# Patient Record
Sex: Male | Born: 1935 | Race: White | Hispanic: No | Smoking: Never smoker
Health system: Southern US, Community
[De-identification: ages and names within clinical notes are randomized; demographics above are authoritative.]

## PROBLEM LIST (undated history)

## (undated) DIAGNOSIS — I442 Atrioventricular block, complete: Secondary | ICD-10-CM

## (undated) DIAGNOSIS — K2270 Barrett's esophagus without dysplasia: Secondary | ICD-10-CM

## (undated) DIAGNOSIS — K219 Gastro-esophageal reflux disease without esophagitis: Secondary | ICD-10-CM

## (undated) DIAGNOSIS — I10 Essential (primary) hypertension: Secondary | ICD-10-CM

## (undated) HISTORY — PX: LIVER BIOPSY: SHX301

## (undated) HISTORY — DX: Gastro-esophageal reflux disease without esophagitis: K21.9

## (undated) HISTORY — DX: Atrioventricular block, complete: I44.2

## (undated) HISTORY — DX: Barrett's esophagus without dysplasia: K22.70

## (undated) HISTORY — DX: Essential (primary) hypertension: I10

## (undated) HISTORY — PX: UPPER GASTROINTESTINAL ENDOSCOPY: SHX188

## (undated) HISTORY — PX: COLONOSCOPY: SHX174

---

## 2002-10-21 ENCOUNTER — Ambulatory Visit (HOSPITAL_COMMUNITY): Admission: RE | Admit: 2002-10-21 | Discharge: 2002-10-21 | Payer: Self-pay | Admitting: Internal Medicine

## 2005-01-18 ENCOUNTER — Ambulatory Visit: Payer: Self-pay | Admitting: Internal Medicine

## 2006-01-25 ENCOUNTER — Ambulatory Visit: Payer: Self-pay | Admitting: Internal Medicine

## 2010-07-11 ENCOUNTER — Ambulatory Visit (INDEPENDENT_AMBULATORY_CARE_PROVIDER_SITE_OTHER): Payer: Medicare HMO | Admitting: Internal Medicine

## 2010-07-11 DIAGNOSIS — K7689 Other specified diseases of liver: Secondary | ICD-10-CM

## 2010-07-11 DIAGNOSIS — K219 Gastro-esophageal reflux disease without esophagitis: Secondary | ICD-10-CM

## 2011-06-21 ENCOUNTER — Encounter (INDEPENDENT_AMBULATORY_CARE_PROVIDER_SITE_OTHER): Payer: Self-pay | Admitting: *Deleted

## 2011-07-10 ENCOUNTER — Ambulatory Visit (INDEPENDENT_AMBULATORY_CARE_PROVIDER_SITE_OTHER): Payer: Medicare HMO | Admitting: Internal Medicine

## 2011-08-21 ENCOUNTER — Ambulatory Visit (INDEPENDENT_AMBULATORY_CARE_PROVIDER_SITE_OTHER): Payer: Medicare HMO | Admitting: Internal Medicine

## 2011-10-15 ENCOUNTER — Ambulatory Visit (INDEPENDENT_AMBULATORY_CARE_PROVIDER_SITE_OTHER): Payer: Medicare HMO | Admitting: Internal Medicine

## 2011-10-15 ENCOUNTER — Encounter (INDEPENDENT_AMBULATORY_CARE_PROVIDER_SITE_OTHER): Payer: Self-pay | Admitting: Internal Medicine

## 2011-10-15 VITALS — BP 140/90 | HR 78 | Temp 97.7°F | Resp 20 | Ht 68.0 in | Wt 202.7 lb

## 2011-10-15 DIAGNOSIS — K7469 Other cirrhosis of liver: Secondary | ICD-10-CM

## 2011-10-15 DIAGNOSIS — I10 Essential (primary) hypertension: Secondary | ICD-10-CM | POA: Insufficient documentation

## 2011-10-15 DIAGNOSIS — K429 Umbilical hernia without obstruction or gangrene: Secondary | ICD-10-CM | POA: Insufficient documentation

## 2011-10-15 DIAGNOSIS — K219 Gastro-esophageal reflux disease without esophagitis: Secondary | ICD-10-CM

## 2011-10-15 DIAGNOSIS — K7460 Unspecified cirrhosis of liver: Secondary | ICD-10-CM

## 2011-10-15 DIAGNOSIS — Z86010 Personal history of colonic polyps: Secondary | ICD-10-CM | POA: Insufficient documentation

## 2011-10-15 NOTE — Progress Notes (Signed)
Presenting complaint;  Followup for cirrhosis and GERD.  Subjective:  Patient is 78 year old Caucasian male who is here for scheduled visit he was last seen in May 2012. He has lost about 5 pounds. He feels fine. He has good appetite. He denies heartburn dysphagia abdominal pain melena or rectal bleeding. His bowels move every day. 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 He lost his wife earlier this year secondary to CHF. He is not having any pain due to umbilicus hernia.  Current Medications: Current Outpatient Prescriptions  Medication Sig Dispense Refill  . aspirin 81 MG tablet Take 81 mg by mouth daily.      . metoprolol (LOPRESSOR) 50 MG tablet Take 50 mg by mouth. Patient takes 1/2 tablet  Twice a day      . milk thistle 175 MG tablet Take 175 mg by mouth.      . MULTIPLE VITAMINS PO Take by mouth daily.      Marland Kitchen omeprazole (PRILOSEC) 20 MG capsule Take by mouth.      . vitamin E 400 UNIT capsule Take 400 Units by mouth daily.         Objective: Blood pressure 140/90, pulse 78, temperature 97.7 F (36.5 C), temperature source Oral, resp. rate 20, height 5\' 8"  (1.727 m), weight 202 lb 11.2 oz (91.944 kg). Patient is alert and does not have asterixis Conjunctiva is pink. Sclera is nonicteric Oropharyngeal mucosa is normal. No neck masses or thyromegaly noted. He has bilateral painless gynecomastia. Cardiac exam with regular rhythm normal S1 and S2. No murmur or gallop noted. Lungs are clear to auscultation. Abdomen is full. Small umbilicus hernia which is completely reducible but skin has bluish discoloration. Abdomen is soft. Spleen is not palpable. Liver edge is indistinct below RCM. Shifting dullness is absent. No LE edema or clubbing noted.   Assessment:  #1. Cryptogenic cirrhosis. He presented in December 2001 with ascites. Diagnosis confirmed via laparoscopic biopsy. His condition is improved great deal and he has not required diuretic therapy for past few years. He needs to be  screened for [facility]. Last ultrasound was in may 2012. #2. Chronic GERD complicated by short segment Barrett's esophagus. Symptoms are well controlled with therapy. Last EGD was in March 2010. #3. History of colonic adenomas. Last colonoscopy in March 2010. #4. Umbilicus hernia. It is not causing any symptoms the skin has bluish discoloration. Therefore he needs to keep an eye on it and let us or Dr. Sherril Croon know if there's any change   Plan:  He will go to the lab for CBC, comprehensive chemistry panel, TSH and AFP. Hepatic ultrasound for screening. I will contact patient with results of the studies and plan to see him back in one year. Next EGD and colonoscopy would be in March 2015.

## 2011-10-15 NOTE — Patient Instructions (Signed)
Physician will contact you with results of ultrasound and blood work and copy will be sent to Dr. Sherril Croon.

## 2011-10-16 LAB — COMPREHENSIVE METABOLIC PANEL
AST: 20 U/L (ref 0–37)
Alkaline Phosphatase: 72 U/L (ref 39–117)
BUN: 12 mg/dL (ref 6–23)
Creat: 0.9 mg/dL (ref 0.50–1.35)

## 2011-10-16 LAB — CBC
HCT: 39.4 % (ref 39.0–52.0)
MCH: 32 pg (ref 26.0–34.0)
MCHC: 35 g/dL (ref 30.0–36.0)
MCV: 91.4 fL (ref 78.0–100.0)
RDW: 13 % (ref 11.5–15.5)

## 2011-11-01 ENCOUNTER — Telehealth (INDEPENDENT_AMBULATORY_CARE_PROVIDER_SITE_OTHER): Payer: Self-pay | Admitting: *Deleted

## 2011-11-01 DIAGNOSIS — K7460 Unspecified cirrhosis of liver: Secondary | ICD-10-CM

## 2011-11-01 NOTE — Telephone Encounter (Signed)
Per Dr.Rehman the patient will need a AFP done in 6 months,noted for May 03 2013

## 2012-02-05 ENCOUNTER — Encounter (INDEPENDENT_AMBULATORY_CARE_PROVIDER_SITE_OTHER): Payer: Self-pay

## 2012-04-17 ENCOUNTER — Encounter (INDEPENDENT_AMBULATORY_CARE_PROVIDER_SITE_OTHER): Payer: Self-pay | Admitting: *Deleted

## 2012-04-17 ENCOUNTER — Telehealth (INDEPENDENT_AMBULATORY_CARE_PROVIDER_SITE_OTHER): Payer: Self-pay | Admitting: *Deleted

## 2012-04-17 DIAGNOSIS — K7460 Unspecified cirrhosis of liver: Secondary | ICD-10-CM

## 2012-04-17 NOTE — Telephone Encounter (Signed)
Lab order printed

## 2012-05-07 LAB — AFP TUMOR MARKER: AFP-Tumor Marker: 4 ng/mL (ref 0.0–8.0)

## 2013-12-15 ENCOUNTER — Encounter (INDEPENDENT_AMBULATORY_CARE_PROVIDER_SITE_OTHER): Payer: Self-pay | Admitting: Internal Medicine

## 2013-12-15 ENCOUNTER — Ambulatory Visit (INDEPENDENT_AMBULATORY_CARE_PROVIDER_SITE_OTHER): Payer: PRIVATE HEALTH INSURANCE | Admitting: Internal Medicine

## 2013-12-15 ENCOUNTER — Encounter (INDEPENDENT_AMBULATORY_CARE_PROVIDER_SITE_OTHER): Payer: Self-pay | Admitting: *Deleted

## 2013-12-15 VITALS — BP 130/76 | HR 72 | Temp 97.2°F | Resp 18 | Ht 68.0 in | Wt 207.0 lb

## 2013-12-15 DIAGNOSIS — K7460 Unspecified cirrhosis of liver: Secondary | ICD-10-CM

## 2013-12-15 DIAGNOSIS — K7469 Other cirrhosis of liver: Secondary | ICD-10-CM

## 2013-12-15 DIAGNOSIS — K2270 Barrett's esophagus without dysplasia: Secondary | ICD-10-CM

## 2013-12-15 DIAGNOSIS — K2100 Gastro-esophageal reflux disease with esophagitis, without bleeding: Secondary | ICD-10-CM

## 2013-12-15 NOTE — Patient Instructions (Signed)
Ultrasound of upper abdomen to be scheduled. Request recent blood work from Dr.Vyas,s before any tests ordered

## 2013-12-15 NOTE — Progress Notes (Signed)
Presenting complaint;  Followup for GERD with Barrett,s and cryptogenic cirrhosis.  Subjective:  Patient is 80 year old Caucasian male who presents for scheduled visit. He was last seen in July 2013. He was supposed to come back in July 2014. He states he kept waiting and never heard from our office and finally decided to call to make an appointment. He has no complaints. Even though he is retired he stays busy. 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 He rarely experiences heartburn. He also denies dysphagia cough sore throat or hoarseness. He also denies abdominal distention or pain. Bowels move daily. He denies melena or rectal bleeding. He has very good appetite and has gained 5 pounds since his last visit.  GI history;  Patient was diagnosed with cirrhosis when he presented with new onset of ascites in December 2001. Biochemical markers were negative. He had laparoscopic liver biopsy and found to have stage IV cirrhosis felt to be cryptogenic. He also has chronic GERD and was diagnosed with short segment Barrett's esophagus. Last EGD was in March 2010. He also has history of colonic adenomas and last colonoscopy was in March 2010. Last abdominal ultrasound was in August 2013.    Current Medications: Outpatient Encounter Prescriptions as of 12/15/2013  Medication Sig  . aspirin 81 MG tablet Take 81 mg by mouth daily.  . metoprolol (LOPRESSOR) 50 MG tablet Take 50 mg by mouth. Patient takes 1/2 tablet  Twice a day  . milk thistle 175 MG tablet Take 175 mg by mouth.  . MULTIPLE VITAMINS PO Take by mouth daily.  Marland Kitchen omeprazole (PRILOSEC) 20 MG capsule Take by mouth.  . vitamin E 400 UNIT capsule Take 400 Units by mouth. Patient states that he takes 1 by mouth once a week.    Objective: Blood pressure 130/76, pulse 72, temperature 97.2 F (36.2 C), temperature source Oral, resp. rate 18, height 5\' 8"  (1.727 m), weight 207 lb (93.895  kg). Patient is alert and does not have asterixis. Conjunctiva is pink. Sclera is nonicteric Oropharyngeal mucosa is normal. No neck masses or thyromegaly noted. Cardiac exam with regular rhythm normal S1 and S2. No murmur or gallop noted. Lungs are clear to auscultation. Abdomen is full but soft and nontender without organomegaly or masses.  No LE edema or clubbing noted.  Labs/studies Results:   lab studies been requested from dr.Vyas,s office.  Assessment:  #1. Cryptogenic cirrhosis. He had ascites on initial presentation 14 years ago but his condition has progressed with resolution of ascites and preserved hepatic function. He is due for [facility] screening. #2. GERD complicated by short segment Barrett's. Heartburn is well controlled with therapy. Last EGD was in March 2010. #. History of colonic adenomas. Last colonoscopy was in March 2010.   Plan:  Request copy of blood work from Dr. Marcial Pacas office before any tests ordered. Upper abdominal ultrasound. Was scheduled for EGD and colonoscopy when patient is ready.

## 2014-07-08 ENCOUNTER — Encounter (INDEPENDENT_AMBULATORY_CARE_PROVIDER_SITE_OTHER): Payer: Self-pay

## 2014-08-31 ENCOUNTER — Encounter (INDEPENDENT_AMBULATORY_CARE_PROVIDER_SITE_OTHER): Payer: Self-pay | Admitting: *Deleted

## 2014-12-16 ENCOUNTER — Ambulatory Visit (INDEPENDENT_AMBULATORY_CARE_PROVIDER_SITE_OTHER): Payer: Medicare Other | Admitting: Internal Medicine

## 2014-12-16 ENCOUNTER — Encounter (INDEPENDENT_AMBULATORY_CARE_PROVIDER_SITE_OTHER): Payer: Self-pay | Admitting: Internal Medicine

## 2014-12-16 ENCOUNTER — Encounter (INDEPENDENT_AMBULATORY_CARE_PROVIDER_SITE_OTHER): Payer: Self-pay | Admitting: *Deleted

## 2014-12-16 VITALS — BP 110/62 | HR 56 | Temp 98.6°F | Ht 68.0 in | Wt 200.9 lb

## 2014-12-16 DIAGNOSIS — K7460 Unspecified cirrhosis of liver: Secondary | ICD-10-CM

## 2014-12-16 LAB — LIPID PANEL
CHOL/HDL RATIO: 4.3 ratio (ref <=5.0)
CHOLESTEROL: 212 mg/dL — AB (ref 125–200)
HDL: 49 mg/dL (ref >=40)
LDL Cholesterol: 134 mg/dL — ABNORMAL HIGH (ref <130)
TRIGLYCERIDES: 143 mg/dL (ref <150)
VLDL: 29 mg/dL (ref <30)

## 2014-12-16 LAB — CBC WITH DIFFERENTIAL/PLATELET
BASOS ABS: 0.1 10*3/uL (ref 0.0–0.1)
Basophils Relative: 2 % — ABNORMAL HIGH (ref 0–1)
Eosinophils Absolute: 0.2 10*3/uL (ref 0.0–0.7)
Eosinophils Relative: 4 % (ref 0–5)
HEMATOCRIT: 40 % (ref 39.0–52.0)
Hemoglobin: 13.9 g/dL (ref 13.0–17.0)
LYMPHS ABS: 1.2 10*3/uL (ref 0.7–4.0)
Lymphocytes Relative: 23 % (ref 12–46)
MCH: 32.3 pg (ref 26.0–34.0)
MCHC: 34.8 g/dL (ref 30.0–36.0)
MCV: 93 fL (ref 78.0–100.0)
MONOS PCT: 12 % (ref 3–12)
MPV: 10.9 fL (ref 8.6–12.4)
Monocytes Absolute: 0.6 10*3/uL (ref 0.1–1.0)
NEUTROS ABS: 3.2 10*3/uL (ref 1.7–7.7)
NEUTROS PCT: 59 % (ref 43–77)
PLATELETS: 144 10*3/uL — AB (ref 150–400)
RBC: 4.3 MIL/uL (ref 4.22–5.81)
RDW: 13.4 % (ref 11.5–15.5)
WBC: 5.4 10*3/uL (ref 4.0–10.5)

## 2014-12-16 LAB — HEPATIC FUNCTION PANEL
ALT: 17 U/L (ref 9–46)
AST: 20 U/L (ref 10–35)
Albumin: 4.3 g/dL (ref 3.6–5.1)
Alkaline Phosphatase: 85 U/L (ref 40–115)
BILIRUBIN DIRECT: 0.2 mg/dL (ref <=0.2)
BILIRUBIN INDIRECT: 0.6 mg/dL (ref 0.2–1.2)
BILIRUBIN TOTAL: 0.8 mg/dL (ref 0.2–1.2)
Total Protein: 7.1 g/dL (ref 6.1–8.1)

## 2014-12-16 NOTE — Patient Instructions (Addendum)
Labs. OV in 1 year. 

## 2014-12-16 NOTE — Progress Notes (Signed)
   Subjective:    Patient ID: Brett Bryant, male    DOB: [birth year], 81 y.o.   MRN: [id number]  HPI Here today for f/u of his cirrhosis.  Diagnosed with cirrhosis in 2001 with new onset of ascites. Biochemical markers were negative. He says he is doing good. He stays busy all the time. He takes care of the farm animals. Hx of chronic GERD and has short segment Barrett's esophagus. Last EGD in 2010.  His appetite has been good. He has lost 6 pounds since his last visit in September. No abdominal distention. No asterixis   No confusion.  Acid reflux controlled with Omeprazole.   Patient was diagnosed with cirrhosis when he presented with new onset of ascites in December 2001. Biochemical markers were negative. He had laparoscopic liver biopsy and found to have stage IV cirrhosis (Per Dr. Olevia Perches notes).   12/22/2013 US abdomen complete: cirrhosis:  No splenomegaly. Chololithiasis. Somewhat small liver but without definite marginal nodularity.   10/02/2013  H and H 13.9 and 39.5, Platelets 166, albumin 4.3, total bili 0.5, ALP 75, AST 23, ALT 20   Review of Systems Past Medical History  Diagnosis Date  . Hypertension   . GERD (gastroesophageal reflux disease)     Past Surgical History  Procedure Laterality Date  . Liver biopsy      Several Years ago per patient  . Colonoscopy    . Upper gastrointestinal endoscopy      No Known Allergies  Current Outpatient Prescriptions on File Prior to Visit  Medication Sig Dispense Refill  . aspirin 81 MG tablet Take 81 mg by mouth daily.    . metoprolol (LOPRESSOR) 50 MG tablet Take 50 mg by mouth. Patient takes 1/2 tablet  Twice a day    . milk thistle 175 MG tablet Take 175 mg by mouth.    . MULTIPLE VITAMINS PO Take by mouth daily.    Marland Kitchen omeprazole (PRILOSEC) 20 MG capsule Take by mouth.     No current facility-administered medications on file prior to visit.        Objective:   Physical Exam Blood pressure 110/62, pulse 56,  temperature 98.6 F (37 C), height 5\' 8"  (1.727 m), weight 200 lb 14.4 oz (91.128 kg). Alert and oriented. Skin warm and dry. Oral mucosa is moist.   . Sclera anicteric, conjunctivae is pink. Thyroid not enlarged. No cervical lymphadenopathy. Lungs clear. Heart regular rate and rhythm.  Abdomen is soft. Bowel sounds are positive. No hepatomegaly. No abdominal masses felt. No tenderness.  No edema to lower extremities.  No asterixis.       Assessment & Plan:  Cirrhosis. He is doing well. No abdominal distention. Liver numbers are normal. [place] RUQ, AFP, CBC with diff, Hepatic function. Lipid profile. OV in1 year.

## 2014-12-17 LAB — AFP TUMOR MARKER: AFP TUMOR MARKER: 4 ng/mL (ref <6.1)

## 2014-12-22 ENCOUNTER — Ambulatory Visit (HOSPITAL_COMMUNITY)
Admission: RE | Admit: 2014-12-22 | Discharge: 2014-12-22 | Disposition: A | Discharge status: Home / Self Care | Payer: Medicare Other | Source: Ambulatory Visit | Attending: Internal Medicine | Admitting: Internal Medicine

## 2014-12-22 DIAGNOSIS — K8020 Calculus of gallbladder without cholecystitis without obstruction: Secondary | ICD-10-CM | POA: Insufficient documentation

## 2014-12-22 DIAGNOSIS — K7460 Unspecified cirrhosis of liver: Secondary | ICD-10-CM

## 2014-12-22 DIAGNOSIS — R932 Abnormal findings on diagnostic imaging of liver and biliary tract: Secondary | ICD-10-CM | POA: Insufficient documentation

## 2015-08-11 ENCOUNTER — Encounter (INDEPENDENT_AMBULATORY_CARE_PROVIDER_SITE_OTHER): Payer: Self-pay | Admitting: Internal Medicine

## 2015-12-15 ENCOUNTER — Encounter (INDEPENDENT_AMBULATORY_CARE_PROVIDER_SITE_OTHER): Payer: Self-pay | Admitting: *Deleted

## 2015-12-15 ENCOUNTER — Ambulatory Visit (INDEPENDENT_AMBULATORY_CARE_PROVIDER_SITE_OTHER): Payer: 59 | Admitting: Internal Medicine

## 2015-12-15 ENCOUNTER — Encounter (INDEPENDENT_AMBULATORY_CARE_PROVIDER_SITE_OTHER): Payer: Self-pay | Admitting: Internal Medicine

## 2015-12-15 VITALS — BP 180/80 | HR 65 | Temp 98.5°F | Ht 68.0 in | Wt 206.6 lb

## 2015-12-15 DIAGNOSIS — K2270 Barrett's esophagus without dysplasia: Secondary | ICD-10-CM

## 2015-12-15 DIAGNOSIS — K760 Fatty (change of) liver, not elsewhere classified: Secondary | ICD-10-CM

## 2015-12-15 DIAGNOSIS — Z86010 Personal history of colonic polyps: Secondary | ICD-10-CM

## 2015-12-15 HISTORY — DX: Barrett's esophagus without dysplasia: K22.70

## 2015-12-15 NOTE — Patient Instructions (Signed)
Ov in 1 one year.

## 2015-12-15 NOTE — Progress Notes (Signed)
Subjective:    Patient ID: Brett Bryant, male    DOB: 1933-09-21, 82 y.o.   MRN: [id number]  HPI Here today for f/u of his cirrhosis.  Diagnosed with cirrhosis in 2001 with new onset of ascites. Biochemical markers were negative.  . He had laparoscopic liver biopsy and found to have stage IV cirrhosis (Per Dr. Olevia Perches notes). His liver enzymes in 2016 were normal.  Hx of short segment Barrett's and colonic adenomas.  He tells me he is doing good. No weight loss. His appetite has remained good. BM daily. No melena or BRRB. No abdominal pain. No confusion.    Hepatic Function Latest Ref Rng & Units 12/16/2014 10/15/2011  Total Protein 6.1 - 8.1 g/dL 7.1 6.8  Albumin 3.6 - 5.1 g/dL 4.3 4.4  AST 10 - 35 U/L 20 20  ALT 9 - 46 U/L 17 17  Alk Phosphatase 40 - 115 U/L 85 72  Total Bilirubin 0.2 - 1.2 mg/dL 0.8 0.4  Bilirubin, Direct <=0.2 mg/dL 0.2 -   CBC    Component Value Date/Time   WBC 5.4 12/16/2014 1059   RBC 4.30 12/16/2014 1059   HGB 13.9 12/16/2014 1059   HCT 40.0 12/16/2014 1059   PLT 144 (L) 12/16/2014 1059   MCV 93.0 12/16/2014 1059   MCH 32.3 12/16/2014 1059   MCHC 34.8 12/16/2014 1059   RDW 13.4 12/16/2014 1059   LYMPHSABS 1.2 12/16/2014 1059   MONOABS 0.6 12/16/2014 1059   EOSABS 0.2 12/16/2014 1059   BASOSABS 0.1 12/16/2014 1059     12/22/2014 [place] RUQ: cirrhosis;  The liver is somewhat echogenic consistent with fatty infiltration. The contours of the liver may be minimally nodular suggesting possible cirrhosis, but no focal hepatic abnormality is seen.  IMPRESSION: 1. Somewhat echogenic liver with minimal nodularity of the periphery suggesting possible cirrhosis. No focal abnormality. 2. Small gallstones.    Review of Systems      Past Medical History:  Diagnosis Date  . Barrett's esophagus 12/15/2015  . GERD (gastroesophageal reflux disease)   . Hypertension     Past Surgical History:  Procedure Laterality Date  . COLONOSCOPY    . LIVER BIOPSY      Several Years ago per patient  . UPPER GASTROINTESTINAL ENDOSCOPY      No Known Allergies  Current Outpatient Prescriptions on File Prior to Visit  Medication Sig Dispense Refill  . aspirin 81 MG tablet Take 81 mg by mouth daily.    . metoprolol (LOPRESSOR) 50 MG tablet Take 50 mg by mouth. Patient takes 1/2 tablet  Twice a day    . milk thistle 175 MG tablet Take 175 mg by mouth.    . MULTIPLE VITAMINS PO Take by mouth daily.    Marland Kitchen omeprazole (PRILOSEC) 20 MG capsule Take by mouth.     No current facility-administered medications on file prior to visit.       Objective:   Physical Exam Blood pressure (!) 180/80, pulse 65, temperature 98.5 F (36.9 C), height '5\' 8"'  (1.727 m), weight 206 lb 9.6 oz (93.7 kg). Alert and oriented. Skin warm and dry. Oral mucosa is moist.   . Sclera anicteric, conjunctivae is pink. Thyroid not enlarged. No cervical lymphadenopathy. Lungs clear. Heart regular rate and rhythm.  Abdomen is soft. Bowel sounds are positive. No hepatomegaly. No abdominal masses felt. No tenderness.  No edema to lower extremities.          Assessment & Plan:  NAFLD. He seems to  be doing well. Normal liver enzymes. CMET, CBC,  [place] RUQ. OV in 1 year.  . Chronic GERD complicated by short segment Barrett's esophagus. Symptoms are well controlled with therapy. Last EGD was in March 2010.  History of colonic adenomas. Last colonoscopy was in March 2010. EGD/Colonoscopy. Patient will let me know when he is ready to have this scheduled.

## 2015-12-16 LAB — CBC WITH DIFFERENTIAL/PLATELET
BASOS PCT: 1 %
Basophils Absolute: 56 cells/uL (ref 0–200)
EOS ABS: 224 {cells}/uL (ref 15–500)
EOS PCT: 4 %
HCT: 40.8 % (ref 38.5–50.0)
Hemoglobin: 13.6 g/dL (ref 13.2–17.1)
LYMPHS PCT: 27 %
Lymphs Abs: 1512 cells/uL (ref 850–3900)
MCH: 31.7 pg (ref 27.0–33.0)
MCHC: 33.3 g/dL (ref 32.0–36.0)
MCV: 95.1 fL (ref 80.0–100.0)
MONOS PCT: 12 %
MPV: 11.4 fL (ref 7.5–12.5)
Monocytes Absolute: 672 cells/uL (ref 200–950)
NEUTROS ABS: 3136 {cells}/uL (ref 1500–7800)
Neutrophils Relative %: 56 %
PLATELETS: 175 10*3/uL (ref 140–400)
RBC: 4.29 MIL/uL (ref 4.20–5.80)
RDW: 14 % (ref 11.0–15.0)
WBC: 5.6 10*3/uL (ref 3.8–10.8)

## 2015-12-16 LAB — COMPREHENSIVE METABOLIC PANEL
ALK PHOS: 67 U/L (ref 40–115)
ALT: 26 U/L (ref 9–46)
AST: 30 U/L (ref 10–35)
Albumin: 4 g/dL (ref 3.6–5.1)
BUN: 10 mg/dL (ref 7–25)
CHLORIDE: 103 mmol/L (ref 98–110)
CO2: 24 mmol/L (ref 20–31)
CREATININE: 0.86 mg/dL (ref 0.70–1.11)
Calcium: 10.7 mg/dL — ABNORMAL HIGH (ref 8.6–10.3)
GLUCOSE: 88 mg/dL (ref 65–99)
POTASSIUM: 4.7 mmol/L (ref 3.5–5.3)
SODIUM: 137 mmol/L (ref 135–146)
TOTAL PROTEIN: 6.9 g/dL (ref 6.1–8.1)
Total Bilirubin: 0.7 mg/dL (ref 0.2–1.2)

## 2015-12-21 ENCOUNTER — Ambulatory Visit (HOSPITAL_COMMUNITY)
Admission: RE | Admit: 2015-12-21 | Discharge: 2015-12-21 | Disposition: A | Discharge status: Home / Self Care | Payer: Medicare PPO | Source: Ambulatory Visit | Attending: Internal Medicine | Admitting: Internal Medicine

## 2015-12-21 DIAGNOSIS — K760 Fatty (change of) liver, not elsewhere classified: Secondary | ICD-10-CM | POA: Insufficient documentation

## 2015-12-21 DIAGNOSIS — K8020 Calculus of gallbladder without cholecystitis without obstruction: Secondary | ICD-10-CM | POA: Insufficient documentation

## 2015-12-21 DIAGNOSIS — R932 Abnormal findings on diagnostic imaging of liver and biliary tract: Secondary | ICD-10-CM | POA: Insufficient documentation

## 2016-07-03 ENCOUNTER — Ambulatory Visit (INDEPENDENT_AMBULATORY_CARE_PROVIDER_SITE_OTHER): Payer: Medicare HMO | Admitting: Urology

## 2016-07-03 DIAGNOSIS — R972 Elevated prostate specific antigen [PSA]: Secondary | ICD-10-CM

## 2016-12-25 ENCOUNTER — Ambulatory Visit (INDEPENDENT_AMBULATORY_CARE_PROVIDER_SITE_OTHER): Payer: Medicare HMO | Admitting: Urology

## 2016-12-25 DIAGNOSIS — N400 Enlarged prostate without lower urinary tract symptoms: Secondary | ICD-10-CM

## 2016-12-25 DIAGNOSIS — R972 Elevated prostate specific antigen [PSA]: Secondary | ICD-10-CM

## 2017-07-15 ENCOUNTER — Encounter (INDEPENDENT_AMBULATORY_CARE_PROVIDER_SITE_OTHER): Payer: Self-pay | Admitting: Internal Medicine

## 2017-07-15 ENCOUNTER — Encounter (INDEPENDENT_AMBULATORY_CARE_PROVIDER_SITE_OTHER): Payer: Self-pay | Admitting: *Deleted

## 2017-07-15 ENCOUNTER — Ambulatory Visit (INDEPENDENT_AMBULATORY_CARE_PROVIDER_SITE_OTHER): Payer: Medicare HMO | Admitting: Internal Medicine

## 2017-07-15 VITALS — BP 140/86 | HR 64 | Temp 98.2°F | Resp 18 | Ht 68.0 in | Wt 205.1 lb

## 2017-07-15 DIAGNOSIS — D126 Benign neoplasm of colon, unspecified: Secondary | ICD-10-CM | POA: Diagnosis not present

## 2017-07-15 DIAGNOSIS — K7460 Unspecified cirrhosis of liver: Secondary | ICD-10-CM

## 2017-07-15 DIAGNOSIS — K219 Gastro-esophageal reflux disease without esophagitis: Secondary | ICD-10-CM | POA: Diagnosis not present

## 2017-07-15 DIAGNOSIS — K2270 Barrett's esophagus without dysplasia: Secondary | ICD-10-CM | POA: Diagnosis not present

## 2017-07-15 NOTE — Patient Instructions (Signed)
OV in 1 year.  

## 2017-07-15 NOTE — Progress Notes (Signed)
Subjective:    Patient ID: Brett Bryant, male    DOB: [birth year], 84 y.o.   MRN: [id number]  HPI Referred by DR. Vyas for elevated liver enzymes. Hx of same and has known hx cirrhosis. Liver enzymes normal in April of this year. Last seen in September of 2017 for NAFLD.   He tells me he is doing well. Appetite is good. No weight loss. Has a BM daily.  He was diagnosed with cirrhosis in 2001 with new onset of ascites. He also had laproscopic liver biopsy and found to have stage 4 cirrhosis.  His last EGD/Colonosocpy was in 2010 for chronic GERD, Barrett's which revealed two short columns of grade 1 esophageal varices. Short segment Barrett's without obvious  Ulceration or mass. Three gastric polyps snared from antrum. The large one was 15-20 mm in diameter. Single diverticulum at hepatic flexure. Small polyp ileocecal valve and 6 mm polyp splenic flexure. Biopsy tubular adenoma. Esophagus: Barrett's    12/21/2015 [place] RUQ:  IMPRESSION: Cholelithiasis. No gallbladder wall thickening or pericholecystic fluid.  The liver echogenicity is increased with a subtly nodular contour. These are findings indicative of hepatic cirrhosis. While no focal liver lesions are identified, it must be cautioned that the sensitivity of ultrasound for focal liver lesions is diminished in this circumstance.  06/27/2017 total bili 0.4, ALP 106, AST 23, ALT 29.  H and H 13.6 and 40.5, Platelet ct 167    Review of Systems Past Medical History:  Diagnosis Date  . Barrett's esophagus 12/15/2015  . GERD (gastroesophageal reflux disease)   . Hypertension     Past Surgical History:  Procedure Laterality Date  . COLONOSCOPY    . LIVER BIOPSY     Several Years ago per patient  . UPPER GASTROINTESTINAL ENDOSCOPY      No Known Allergies  Current Outpatient Medications on File Prior to Visit  Medication Sig Dispense Refill  . aspirin 81 MG tablet Take 81 mg by mouth daily.    . Coenzyme Q10 (CO Q 10 PO)  Take 200 mg by mouth daily.    . metoprolol (LOPRESSOR) 50 MG tablet Take 50 mg by mouth. Patient takes 1/2 tablet  Twice a day    . milk thistle 175 MG tablet Take 175 mg by mouth.    Marland Kitchen omeprazole (PRILOSEC) 20 MG capsule Take by mouth.    . TURMERIC CURCUMIN PO Take 500 mg by mouth daily.    . vitamin B-12 (CYANOCOBALAMIN) 500 MCG tablet Take 500 mcg by mouth daily.    . vitamin E 400 UNIT capsule Take 400 Units by mouth daily.     No current facility-administered medications on file prior to visit.         Objective:   Physical Exam Blood pressure 140/86, pulse 64, temperature 98.2 F (36.8 C), temperature source Oral, resp. rate 18, height 5\' 8"  (1.727 m), weight 205 lb 1.6 oz (93 kg). Alert and oriented. Skin warm and dry. Oral mucosa is moist.   . Sclera anicteric, conjunctivae is pink. Thyroid not enlarged. No cervical lymphadenopathy. Lungs clear. Heart regular rate and rhythm.  Abdomen is soft. Bowel sounds are positive. No hepatomegaly. No abdominal masses felt. No tenderness.  No edema to lower extremities.           Assessment & Plan:  NAFLD. Will get an [place] today.  Normal liver enzymes and platelet ct normal.  He will let me know when he is ready for surveillance  EGD/Colonosocopy. Hx of  GERD, Barrett's, Tubular adenoma.

## 2017-07-19 ENCOUNTER — Ambulatory Visit (HOSPITAL_COMMUNITY)
Admission: RE | Admit: 2017-07-19 | Discharge: 2017-07-19 | Disposition: A | Discharge status: Home / Self Care | Payer: Medicare HMO | Source: Ambulatory Visit | Attending: Internal Medicine | Admitting: Internal Medicine

## 2017-07-19 DIAGNOSIS — K8020 Calculus of gallbladder without cholecystitis without obstruction: Secondary | ICD-10-CM | POA: Diagnosis not present

## 2017-07-19 DIAGNOSIS — R932 Abnormal findings on diagnostic imaging of liver and biliary tract: Secondary | ICD-10-CM | POA: Insufficient documentation

## 2017-07-19 DIAGNOSIS — K7460 Unspecified cirrhosis of liver: Secondary | ICD-10-CM | POA: Diagnosis not present

## 2017-08-06 ENCOUNTER — Ambulatory Visit: Payer: Medicare HMO | Admitting: Urology

## 2017-08-06 DIAGNOSIS — N400 Enlarged prostate without lower urinary tract symptoms: Secondary | ICD-10-CM | POA: Diagnosis not present

## 2017-08-06 DIAGNOSIS — R972 Elevated prostate specific antigen [PSA]: Secondary | ICD-10-CM | POA: Diagnosis not present

## 2018-07-15 ENCOUNTER — Encounter (INDEPENDENT_AMBULATORY_CARE_PROVIDER_SITE_OTHER): Payer: Self-pay | Admitting: Internal Medicine

## 2018-07-15 ENCOUNTER — Other Ambulatory Visit: Payer: Self-pay

## 2018-07-15 ENCOUNTER — Ambulatory Visit (INDEPENDENT_AMBULATORY_CARE_PROVIDER_SITE_OTHER): Payer: Medicare HMO | Admitting: Internal Medicine

## 2018-07-15 DIAGNOSIS — K7460 Unspecified cirrhosis of liver: Secondary | ICD-10-CM

## 2018-07-15 NOTE — Progress Notes (Addendum)
Subjective:    Patient ID: Brett Bryant, male    DOB: Jul 20, 1933, 85 y.o.   MRN: [id number]  HPI PCP Dr. Woody Seller. Start Time 10:45am. End time 1055. Total time 10 minutes.  Chief complaint  Cirrhosis.  Patient consents to this telephone OV. He is at home. I am in the office. Telephone OV due to risk of COVID-19 virus. Unable to do video OV.  He was last seen in office 07/15/2017. Also has hx of elevated liver enzymes. Enzymes  06/2017 were normal. Platelet ct normal. US abdomen 08/15/2017 revealed:  1. Cholelithiasis. No gallbladder wall thickening or pericholecystic fluid. 2. Diffuse increase in liver echogenicity, a finding indicative of hepatic steatosis with probable underlying parenchymal liver disease. While no focal liver lesions are evident on this study, it must be cautioned that the sensitivity of ultrasound for detection of focal liver lesions is diminished significantly in this circumstance. Diagnosed with cirrhosis in 2001 with new onset of ascites.  Laparoscopic liver biopsy showed stage 4 cirrhosis.  His last EGD/Colonosocpy was in 2010 for chronic GERD, Barrett's which revealed two short columns of grade 1 esophageal varices. Short segment Barrett's without obvious  Ulceration or mass. Three gastric polyps snared from antrum. The large one was 15-20 mm in diameter. Single diverticulum at hepatic flexure. Small polyp ileocecal valve and 6 mm polyp splenic flexure. Biopsy tubular adenoma. Esophagus: Barrett's  He tells me he is doing good.His appetite is good. No weight loss.  He is trying to exercise. 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 His BMs are normal. Denies abdominal distention.   CMP Latest Ref Rng & Units 12/15/2015 12/16/2014 10/15/2011  Glucose 65 - 99 mg/dL 88 - 90  BUN 7 - 25 mg/dL 10 - 12  Creatinine 0.70 - 1.11 mg/dL 0.86 - 0.90  Sodium 135 - 146 mmol/L 137 - 138  Potassium 3.5 - 5.3 mmol/L 4.7 - 4.8  Chloride 98 - 110 mmol/L 103 - 102  CO2 20 - 31 mmol/L 24 -  30  Calcium 8.6 - 10.3 mg/dL 10.7(H) - 10.3  Total Protein 6.1 - 8.1 g/dL 6.9 7.1 6.8  Total Bilirubin 0.2 - 1.2 mg/dL 0.7 0.8 0.4  Alkaline Phos 40 - 115 U/L 67 85 72  AST 10 - 35 U/L _0 ALT 9 - 46 U/L _1 Hepatic Function Latest Ref Rng & Units 12/15/2015 12/16/2014 10/15/2011  Total Protein 6.1 - 8.1 g/dL 6.9 7.1 6.8  Albumin 3.6 - 5.1 g/dL 4.0 4.3 4.4  AST 10 - 35 U/L _2 ALT 9 - 46 U/L _3 Alk Phosphatase 40 - 115 U/L 67 85 72  Total Bilirubin 0.2 - 1.2 mg/dL 0.7 0.8 0.4  Bilirubin, Direct <=0.2 mg/dL - 0.2 -     Review of Systems Past Medical History:  Diagnosis Date  . Barrett's esophagus 12/15/2015  . GERD (gastroesophageal reflux disease)   . Hypertension     Past Surgical History:  Procedure Laterality Date  . COLONOSCOPY    . LIVER BIOPSY     Several Years ago per patient  . UPPER GASTROINTESTINAL ENDOSCOPY      No Known Allergies  Current Outpatient Medications on File Prior to Visit  Medication Sig Dispense Refill  . aspirin 81 MG tablet Take 81 mg by mouth daily.    . Cholecalciferol (VITAMIN D3) 125 MCG (5000 UT) TABS Take 1,000 Units by mouth daily.    Marland Kitchen  Coenzyme Q10 (CO Q 10 PO) Take 200 mg by mouth daily.    . metoprolol (LOPRESSOR) 50 MG tablet Take 50 mg by mouth. Patient takes 1/2 tablet  Twice a day    . milk thistle 175 MG tablet Take 175 mg by mouth.    Marland Kitchen omeprazole (PRILOSEC) 20 MG capsule Take by mouth.    . TURMERIC CURCUMIN PO Take 500 mg by mouth daily.    . vitamin B-12 (CYANOCOBALAMIN) 500 MCG tablet Take 500 mcg by mouth daily.    . vitamin E 400 UNIT capsule Take 400 Units by mouth daily.     No current facility-administered medications on file prior to visit.         Objective:   Physical Exam Deferred       Assessment & Plan:  Cirrhosis: Needs surveillance [place] RUQ , CBC, Hepatic and AFP. OV in 1 year. Needs surveillance EGD and colonoscopy.Will call when he is ready. Does not want to schedule  now due to the COVID-19 virus.

## 2018-07-18 LAB — CBC
HCT: 39.1 % (ref 38.5–50.0)
Hemoglobin: 13.7 g/dL (ref 13.2–17.1)
MCH: 33.2 pg — ABNORMAL HIGH (ref 27.0–33.0)
MCHC: 35 g/dL (ref 32.0–36.0)
MCV: 94.7 fL (ref 80.0–100.0)
MPV: 12.6 fL — ABNORMAL HIGH (ref 7.5–12.5)
Platelets: 156 10*3/uL (ref 140–400)
RBC: 4.13 10*6/uL — ABNORMAL LOW (ref 4.20–5.80)
RDW: 12 % (ref 11.0–15.0)
WBC: 5.3 10*3/uL (ref 3.8–10.8)

## 2018-07-18 LAB — HEPATIC FUNCTION PANEL
AG Ratio: 1.7 (calc) (ref 1.0–2.5)
ALT: 17 U/L (ref 9–46)
AST: 15 U/L (ref 10–35)
Albumin: 4.3 g/dL (ref 3.6–5.1)
Alkaline phosphatase (APISO): 90 U/L (ref 35–144)
Bilirubin, Direct: 0.1 mg/dL (ref 0.0–0.2)
Globulin: 2.5 g/dL (calc) (ref 1.9–3.7)
Indirect Bilirubin: 0.4 mg/dL (calc) (ref 0.2–1.2)
Total Bilirubin: 0.5 mg/dL (ref 0.2–1.2)
Total Protein: 6.8 g/dL (ref 6.1–8.1)

## 2018-07-18 LAB — AFP TUMOR MARKER: AFP-Tumor Marker: 4.8 ng/mL (ref <6.1)

## 2018-08-19 ENCOUNTER — Other Ambulatory Visit: Payer: Self-pay

## 2018-08-19 ENCOUNTER — Ambulatory Visit (HOSPITAL_COMMUNITY)
Admission: RE | Admit: 2018-08-19 | Discharge: 2018-08-19 | Disposition: A | Discharge status: Home / Self Care | Payer: Medicare HMO | Source: Ambulatory Visit | Attending: Internal Medicine | Admitting: Internal Medicine

## 2018-08-19 DIAGNOSIS — K7460 Unspecified cirrhosis of liver: Secondary | ICD-10-CM | POA: Diagnosis not present

## 2018-09-23 ENCOUNTER — Ambulatory Visit (INDEPENDENT_AMBULATORY_CARE_PROVIDER_SITE_OTHER): Payer: Medicare HMO | Admitting: Urology

## 2018-09-23 DIAGNOSIS — R972 Elevated prostate specific antigen [PSA]: Secondary | ICD-10-CM | POA: Diagnosis not present

## 2018-09-23 DIAGNOSIS — N400 Enlarged prostate without lower urinary tract symptoms: Secondary | ICD-10-CM

## 2019-07-14 ENCOUNTER — Other Ambulatory Visit: Payer: Self-pay | Admitting: Urology

## 2019-07-14 ENCOUNTER — Other Ambulatory Visit: Payer: Self-pay

## 2019-07-14 DIAGNOSIS — R972 Elevated prostate specific antigen [PSA]: Secondary | ICD-10-CM

## 2019-08-25 ENCOUNTER — Encounter: Payer: Self-pay | Admitting: Urology

## 2019-08-25 ENCOUNTER — Other Ambulatory Visit: Payer: Self-pay

## 2019-08-25 ENCOUNTER — Ambulatory Visit (INDEPENDENT_AMBULATORY_CARE_PROVIDER_SITE_OTHER): Payer: Medicare HMO | Admitting: Urology

## 2019-08-25 VITALS — BP 178/73 | HR 80 | Temp 98.1°F | Ht 68.0 in | Wt 182.0 lb

## 2019-08-25 DIAGNOSIS — R972 Elevated prostate specific antigen [PSA]: Secondary | ICD-10-CM | POA: Diagnosis not present

## 2019-08-25 LAB — POCT URINALYSIS DIPSTICK
Bilirubin, UA: NEGATIVE
Blood, UA: NEGATIVE
Glucose, UA: NEGATIVE
Ketones, UA: NEGATIVE
Leukocytes, UA: NEGATIVE
Nitrite, UA: NEGATIVE
Protein, UA: NEGATIVE
Spec Grav, UA: 1.01 (ref 1.010–1.025)
Urobilinogen, UA: 0.2 E.U./dL
pH, UA: 8.5 — AB (ref 5.0–8.0)

## 2019-08-25 NOTE — Progress Notes (Signed)
H&P  Chief Complaint: Elevated PSA  History of Present Illness:   6.8.2021: Most recent PSA was 10.9. He reports stable overall urological health, denying any specific complaints especially with regards to his urination.  IPSS Questionnaire (AUA-7): Over the past month.   1)  How often have you had a sensation of not emptying your bladder completely after you finish urinating?  0 - Not at all  2)  How often have you had to urinate again less than two hours after you finished urinating? 3 - About half the time  3)  How often have you found you stopped and started again several times when you urinated?  0 - Not at all  4) How difficult have you found it to postpone urination?  3 - About half the time  5) How often have you had a weak urinary stream?  0 - Not at all  6) How often have you had to push or strain to begin urination?  0 - Not at all  7) How many times did you most typically get up to urinate from the time you went to bed until the time you got up in the morning?  1 - 1 time  Total score:  0-7 mildly symptomatic   8-19 moderately symptomatic   20-35 severely symptomatic   Total: 7 QoL: 1  (below copied from [facility] records):  Brett Bryant is a 86 year-old male established patient who is here for follow up for further evaluation of his elevated PSA.  His last PSA was performed 06/27/2017. The last PSA value was 5.8. The patient states he does not take 5 alpha reductase inhibitor medication.   He has not had a prostate biopsy done. Patient does not have a family history of prostate cancer.   4.17.2018: Sent by Dr. Manuella Ghazi for elevating PSA trend. The patient denies significant urologic history or voiding symptoms. His PSA in July 2017 was 5. PSA was checked in November 2017 and was up to 6. In January 2018, PSA was 7.9. Recheck here on 07/03/2016 was 4.7.   5.21.2019: Most recent PSA 5.8 checked by Dr Woody Seller. No GU c/os since last visit here. IPSS 2   7.7.2020: Last PSA measured this  last May and according to patient was just under 6. Reports no new urinary sx's nor medications. He is very satisfied with his current urination. He denies issues with erections.    12/19/2018 06/27/17 12/25/16 07/03/16 04/04/16 02/03/16 09/17/15  Total PSA 7.3 ng/dl 5.8 ng/dl 7.2 ng/dl 4.7 ng/dl 7.9 ng/dl 6.0 ng/dl 5.0 ng/dl  Free PSA   0.9 ng/dl      % Free PSA   13 %  6.8 %      Past Medical History:  Diagnosis Date  . Barrett's esophagus 12/15/2015  . GERD (gastroesophageal reflux disease)   . Hypertension     Past Surgical History:  Procedure Laterality Date  . COLONOSCOPY    . LIVER BIOPSY     Several Years ago per patient  . UPPER GASTROINTESTINAL ENDOSCOPY      Home Medications:  Allergies as of 08/25/2019   No Known Allergies     Medication List       Accurate as of August 25, 2019  9:58 AM. If you have any questions, ask your nurse or doctor.        aspirin 81 MG tablet Take 81 mg by mouth daily.   CO Q 10 PO Take 200 mg by mouth daily.  metoprolol tartrate 50 MG tablet Commonly known as: LOPRESSOR Take 50 mg by mouth. Patient takes 1/2 tablet  Twice a day   milk thistle 175 MG tablet Take 175 mg by mouth.   omeprazole 20 MG capsule Commonly known as: PRILOSEC Take by mouth.   TURMERIC CURCUMIN PO Take 500 mg by mouth daily.   vitamin B-12 500 MCG tablet Commonly known as: CYANOCOBALAMIN Take 500 mcg by mouth daily.   Vitamin D3 125 MCG (5000 UT) Tabs Take 1,000 Units by mouth daily.   vitamin E 180 MG (400 UNITS) capsule Take 400 Units by mouth daily.       Allergies: No Known Allergies  Family History  Problem Relation Age of Onset  . Alzheimer's disease Sister   . Healthy Sister   . Heart disease Sister   . Healthy Brother   . Heart disease Brother   . Kidney disease Brother   . Heart disease Brother   . Healthy Daughter     Social History:  reports that he has never smoked. He has never used smokeless tobacco. He reports that  he does not drink alcohol or use drugs.  ROS: Urological Symptom Review  Patient is experiencing the following symptoms: Frequent urination Review of Systems Gastrointestinal (upper)  : Negative for upper GI symptoms Gastrointestinal (lower) : Negative for lower GI symptoms Constitutional : Negative for symptoms Skin: Negative for skin symptoms Eyes: Negative for eye symptoms Ear/Nose/Throat : Negative Hematologic/Lymphatic: Negative for Hematologic/Lymphatic symptoms Cardiovascular : Negative for cardiovascular symptoms Respiratory : Negative for respiratory symptoms Endocrine: Negative for endocrine symptoms Musculoskeletal: Negative for musculoskeletal symptom Neurological: Negative for neurological symptoms Psychologic: Negative for psychiatric symptoms   Physical Exam:  Vital signs in last 24 hours: There were no vitals taken for this visit. Constitutional:  Alert and oriented, No acute distress Cardiovascular: Regular rate  Respiratory: Normal respiratory effort GI:  No hernias. Genitourinary: Normal male phallus (uncircumcised), mild meatal stenosis, mild bxo, testes are descended bilaterally and non-tender and without masses, scrotum is normal in appearance without lesions or masses, perineum is normal on inspection. Prostate feels around 60 grams, no nodularity.  Lymphatic: No lymphadenopathy Neurologic: Grossly intact, no focal deficits Psychiatric: Normal mood and affect   Renal Function: No results for input(s): CREATININE in the last 168 hours. CrCl cannot be calculated (Patient's most recent lab result is older than the maximum 21 days allowed.).  Radiologic Imaging: No results found.  Impression/Assessment:  His PSA does seem to be significantly elevated -- will recheck today and move forward with bx if needed. He is not having any urinary sx's and is not currently on any medications.   Plan:  1. PSA today   2. Pending results, will plan  appropriate follow-up. If it remains elevated, I think it would be appropriate to move forward with TRUSP/bx. If down, followup 1 year

## 2019-08-25 NOTE — Progress Notes (Signed)
See progress notes

## 2019-08-26 LAB — PSA: PSA: 8.7 ng/mL — ABNORMAL HIGH (ref <=4.0)

## 2020-05-29 ENCOUNTER — Emergency Department (HOSPITAL_COMMUNITY): Payer: Medicare HMO

## 2020-05-29 ENCOUNTER — Encounter (HOSPITAL_COMMUNITY): Payer: Self-pay | Admitting: Emergency Medicine

## 2020-05-29 ENCOUNTER — Observation Stay (HOSPITAL_COMMUNITY)
Admission: EM | Admit: 2020-05-29 | Discharge: 2020-05-30 | Disposition: A | Discharge status: Home / Self Care | Payer: Medicare HMO | Attending: Neurosurgery | Admitting: Neurosurgery

## 2020-05-29 ENCOUNTER — Other Ambulatory Visit: Payer: Self-pay

## 2020-05-29 DIAGNOSIS — Z7982 Long term (current) use of aspirin: Secondary | ICD-10-CM | POA: Diagnosis not present

## 2020-05-29 DIAGNOSIS — W19XXXA Unspecified fall, initial encounter: Secondary | ICD-10-CM | POA: Diagnosis not present

## 2020-05-29 DIAGNOSIS — S065X9A Traumatic subdural hemorrhage with loss of consciousness of unspecified duration, initial encounter: Principal | ICD-10-CM | POA: Diagnosis present

## 2020-05-29 DIAGNOSIS — Z20822 Contact with and (suspected) exposure to covid-19: Secondary | ICD-10-CM | POA: Diagnosis not present

## 2020-05-29 DIAGNOSIS — S0101XA Laceration without foreign body of scalp, initial encounter: Secondary | ICD-10-CM

## 2020-05-29 DIAGNOSIS — Z79899 Other long term (current) drug therapy: Secondary | ICD-10-CM | POA: Insufficient documentation

## 2020-05-29 DIAGNOSIS — I10 Essential (primary) hypertension: Secondary | ICD-10-CM | POA: Insufficient documentation

## 2020-05-29 DIAGNOSIS — S065X3A Traumatic subdural hemorrhage with loss of consciousness of 1 hour to 5 hours 59 minutes, initial encounter: Secondary | ICD-10-CM | POA: Diagnosis not present

## 2020-05-29 DIAGNOSIS — S0990XA Unspecified injury of head, initial encounter: Secondary | ICD-10-CM | POA: Diagnosis present

## 2020-05-29 DIAGNOSIS — S065XAA Traumatic subdural hemorrhage with loss of consciousness status unknown, initial encounter: Secondary | ICD-10-CM | POA: Diagnosis present

## 2020-05-29 LAB — CBC WITH DIFFERENTIAL/PLATELET
Abs Immature Granulocytes: 0.04 10*3/uL (ref 0.00–0.07)
Basophils Absolute: 0 10*3/uL (ref 0.0–0.1)
Basophils Relative: 0 %
Eosinophils Absolute: 0 10*3/uL (ref 0.0–0.5)
Eosinophils Relative: 0 %
HCT: 41.6 % (ref 39.0–52.0)
Hemoglobin: 13.6 g/dL (ref 13.0–17.0)
Immature Granulocytes: 0 %
Lymphocytes Relative: 5 %
Lymphs Abs: 0.5 10*3/uL — ABNORMAL LOW (ref 0.7–4.0)
MCH: 32.6 pg (ref 26.0–34.0)
MCHC: 32.7 g/dL (ref 30.0–36.0)
MCV: 99.8 fL (ref 80.0–100.0)
Monocytes Absolute: 0.7 10*3/uL (ref 0.1–1.0)
Monocytes Relative: 7 %
Neutro Abs: 8.3 10*3/uL — ABNORMAL HIGH (ref 1.7–7.7)
Neutrophils Relative %: 88 %
Platelets: 163 10*3/uL (ref 150–400)
RBC: 4.17 MIL/uL — ABNORMAL LOW (ref 4.22–5.81)
RDW: 12.7 % (ref 11.5–15.5)
WBC: 9.5 10*3/uL (ref 4.0–10.5)
nRBC: 0 % (ref 0.0–0.2)

## 2020-05-29 LAB — COMPREHENSIVE METABOLIC PANEL
ALT: 28 U/L (ref 0–44)
AST: 57 U/L — ABNORMAL HIGH (ref 15–41)
Albumin: 4.1 g/dL (ref 3.5–5.0)
Alkaline Phosphatase: 89 U/L (ref 38–126)
Anion gap: 9 (ref 5–15)
BUN: 15 mg/dL (ref 8–23)
CO2: 25 mmol/L (ref 22–32)
Calcium: 10.6 mg/dL — ABNORMAL HIGH (ref 8.9–10.3)
Chloride: 99 mmol/L (ref 98–111)
Creatinine, Ser: 0.9 mg/dL (ref 0.61–1.24)
GFR, Estimated: >60 mL/min (ref >60)
Glucose, Bld: 159 mg/dL — ABNORMAL HIGH (ref 70–99)
Potassium: 4.7 mmol/L (ref 3.5–5.1)
Sodium: 133 mmol/L — ABNORMAL LOW (ref 135–145)
Total Bilirubin: 0.7 mg/dL (ref 0.3–1.2)
Total Protein: 7.6 g/dL (ref 6.5–8.1)

## 2020-05-29 LAB — URINALYSIS, ROUTINE W REFLEX MICROSCOPIC
Bilirubin Urine: NEGATIVE
Glucose, UA: NEGATIVE mg/dL
Hgb urine dipstick: NEGATIVE
Ketones, ur: NEGATIVE mg/dL
Leukocytes,Ua: NEGATIVE
Nitrite: NEGATIVE
Protein, ur: NEGATIVE mg/dL
Specific Gravity, Urine: 1.013 (ref 1.005–1.030)
pH: 7 (ref 5.0–8.0)

## 2020-05-29 LAB — RESP PANEL BY RT-PCR (FLU A&B, COVID) ARPGX2
Influenza A by PCR: NEGATIVE
Influenza B by PCR: NEGATIVE
SARS Coronavirus 2 by RT PCR: NEGATIVE

## 2020-05-29 LAB — TROPONIN I (HIGH SENSITIVITY)
Troponin I (High Sensitivity): 12 ng/L (ref <18)
Troponin I (High Sensitivity): 13 ng/L (ref <18)

## 2020-05-29 LAB — MRSA PCR SCREENING: MRSA by PCR: NEGATIVE

## 2020-05-29 MED ORDER — LABETALOL HCL 5 MG/ML IV SOLN: 10.0000 mg | INTRAVENOUS | Status: DC | PRN

## 2020-05-29 MED ORDER — METOPROLOL SUCCINATE ER 25 MG PO TB24
25.0000 mg | ORAL_TABLET | Freq: Every day | ORAL | Status: DC
  Administered 2020-05-30: 25 mg via ORAL
  Filled 2020-05-29: qty 1

## 2020-05-29 MED ORDER — ACETAMINOPHEN 325 MG PO TABS
650.0000 mg | ORAL_TABLET | ORAL | Status: DC | PRN
  Administered 2020-05-29 – 2020-05-30 (×2): 650 mg via ORAL
  Filled 2020-05-29 (×2): qty 2

## 2020-05-29 MED ORDER — HYDROMORPHONE HCL 1 MG/ML IJ SOLN: 0.5000 mg | INTRAMUSCULAR | Status: DC | PRN

## 2020-05-29 MED ORDER — PROMETHAZINE HCL 25 MG PO TABS: 12.5000 mg | ORAL_TABLET | ORAL | Status: DC | PRN

## 2020-05-29 MED ORDER — HYDROCODONE-ACETAMINOPHEN 5-325 MG PO TABS: 1.0000 | ORAL_TABLET | ORAL | Status: DC | PRN

## 2020-05-29 MED ORDER — LIDOCAINE HCL (PF) 1 % IJ SOLN
INTRAMUSCULAR | Status: AC
  Administered 2020-05-29: 30 mL
  Filled 2020-05-29: qty 30

## 2020-05-29 MED ORDER — ACETAMINOPHEN 325 MG PO TABS
650.0000 mg | ORAL_TABLET | Freq: Once | ORAL | Status: AC
  Administered 2020-05-29: 650 mg via ORAL
  Filled 2020-05-29: qty 2

## 2020-05-29 MED ORDER — POLYETHYLENE GLYCOL 3350 17 G PO PACK: 17.0000 g | PACK | Freq: Every day | ORAL | Status: DC | PRN

## 2020-05-29 MED ORDER — DOCUSATE SODIUM 100 MG PO CAPS
100.0000 mg | ORAL_CAPSULE | Freq: Two times a day (BID) | ORAL | Status: DC
  Administered 2020-05-30: 100 mg via ORAL
  Filled 2020-05-29 (×2): qty 1

## 2020-05-29 MED ORDER — LIDOCAINE HCL (PF) 2 % IJ SOLN: 10.0000 mL | Freq: Once | INTRAMUSCULAR | Status: DC

## 2020-05-29 MED ORDER — ACETAMINOPHEN 650 MG RE SUPP: 650.0000 mg | RECTAL | Status: DC | PRN

## 2020-05-29 MED ORDER — ONDANSETRON HCL 4 MG PO TABS: 4.0000 mg | ORAL_TABLET | ORAL | Status: DC | PRN

## 2020-05-29 MED ORDER — VITAMIN B-12 1000 MCG PO TABS
500.0000 ug | ORAL_TABLET | Freq: Every day | ORAL | Status: DC
  Administered 2020-05-30: 500 ug via ORAL
  Filled 2020-05-29: qty 1

## 2020-05-29 MED ORDER — ONDANSETRON HCL 4 MG/2ML IJ SOLN: 4.0000 mg | INTRAMUSCULAR | Status: DC | PRN

## 2020-05-29 NOTE — ED Provider Notes (Signed)
[facility] EMERGENCY DEPARTMENT Provider Note   CSN: [id number] Arrival date & time: 05/29/20  1157     History Chief Complaint  Patient presents with  . Loss of Consciousness    Brett Bryant is a 87 y.o. male.  The history is provided by the patient. No language interpreter was used.  Loss of Consciousness Episode history:  Single Most recent episode:  Today Duration:  5 hours Timing:  Constant Progression:  Worsening Chronicity:  New Witnessed: no   Relieved by:  Nothing Worsened by:  Nothing Ineffective treatments:  None tried Associated symptoms: no nausea   Pt reports he fell and hit his head.  Pt does not recall how he fell.  Pt unsure if he slipped or passed out  Pt has a laceration on the back of his head     Past Medical History:  Diagnosis Date  . Barrett's esophagus 12/15/2015  . GERD (gastroesophageal reflux disease)   . Hypertension     Patient Active Problem List   Diagnosis Date Noted  . Barrett's esophagus 12/15/2015  . 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 GERD (gastroesophageal reflux disease) 10/15/2011  . History of colonic polyps 10/15/2011  . Hypertension 10/15/2011  . Hernia, umbilical 78/24/2353    Past Surgical History:  Procedure Laterality Date  . COLONOSCOPY    . LIVER BIOPSY     Several Years ago per patient  . UPPER GASTROINTESTINAL ENDOSCOPY         Family History  Problem Relation Age of Onset  . Alzheimer's disease Sister   . Healthy Sister   . Heart disease Sister   . Healthy Brother   . Heart disease Brother   . Kidney disease Brother   . Heart disease Brother   . Healthy Daughter     Social History   Tobacco Use  . Smoking status: Never Smoker  . Smokeless tobacco: Never Used  Vaping Use  . Vaping Use: Never used  Substance Use Topics  . Alcohol use: No  . Drug use: No    Home Medications Prior to Admission medications   Medication Sig Start Date End Date Taking? Authorizing Provider   aspirin 81 MG tablet Take 81 mg by mouth daily.   Yes [provider]  Cholecalciferol (VITAMIN D3) 125 MCG (5000 UT) TABS Take 1,000 Units by mouth daily.   Yes [provider]  Coenzyme Q10 (CO Q 10 PO) Take 200 mg by mouth daily.   Yes [provider]  metoprolol succinate (TOPROL-XL) 25 MG 24 hr tablet Take 25 mg by mouth daily. 05/22/20  Yes [provider]  milk thistle 175 MG tablet Take 175 mg by mouth.   Yes [provider]  omeprazole (PRILOSEC) 20 MG capsule Take 20 mg by mouth daily as needed (heartburn).   Yes [provider]  TURMERIC CURCUMIN PO Take 500 mg by mouth daily.   Yes [provider]  vitamin B-12 (CYANOCOBALAMIN) 500 MCG tablet Take 500 mcg by mouth daily.   Yes [provider]  vitamin E 400 UNIT capsule Take 400 Units by mouth daily.   Yes [provider]  valsartan-hydrochlorothiazide (DIOVAN-HCT) 160-12.5 MG tablet Take 1 tablet by mouth daily.    [provider]    Allergies    Patient has no known allergies.  Review of Systems   Review of Systems  Cardiovascular: Positive for syncope.  Gastrointestinal: Negative for nausea.  All other systems reviewed and are negative.  Physical Exam Updated Vital Signs BP (!) 195/96   Pulse (!) 106   Temp 97.9 F (36.6 C) (Oral)   Resp (!) 22   Ht 5\' 8"  (1.727 m)   Wt 87.1 kg   SpO2 96%   BMI 29.19 kg/m   Physical Exam Vitals and nursing note reviewed.  Constitutional:      Appearance: He is well-developed.  HENT:     Head: Normocephalic.     Comments: 2cm laceration occipital scalp gapping    Nose: Nose normal.     Mouth/Throat:     Mouth: Mucous membranes are moist.  Eyes:     Conjunctiva/sclera: Conjunctivae normal.  Cardiovascular:     Rate and Rhythm: Normal rate and regular rhythm.     Heart sounds: No murmur heard.   Pulmonary:     Effort: Pulmonary effort is normal. No respiratory distress.      Breath sounds: Normal breath sounds.  Abdominal:     Palpations: Abdomen is soft.     Tenderness: There is no abdominal tenderness.  Musculoskeletal:        General: Normal range of motion.     Cervical back: Normal range of motion and neck supple. No tenderness.  Skin:    General: Skin is warm and dry.  Neurological:     General: No focal deficit present.     Mental Status: He is alert.     Comments: 2cm laceration occipital scalp    Psychiatric:        Mood and Affect: Mood normal.     ED Results / Procedures / Treatments   Labs (all labs ordered are listed, but only abnormal results are displayed) Labs Reviewed  CBC WITH DIFFERENTIAL/PLATELET - Abnormal; Notable for the following components:      Result Value   RBC 4.17 (*)    Neutro Abs 8.3 (*)    Lymphs Abs 0.5 (*)    All other components within normal limits  COMPREHENSIVE METABOLIC PANEL  URINALYSIS, ROUTINE W REFLEX MICROSCOPIC  TROPONIN I (HIGH SENSITIVITY)    EKG None  Radiology CT Head Wo Contrast  Result Date: 05/29/2020 CLINICAL DATA:  Syncope with fall and posterior head trauma EXAM: CT HEAD WITHOUT CONTRAST TECHNIQUE: Contiguous axial images were obtained from the base of the skull through the vertex without intravenous contrast. COMPARISON:  None. FINDINGS: Brain: Acute subdural hematoma overlying the left cerebral convexity measuring 9 mm in maximal thickness. Approximately 2 mm of left-to-right midline shift. No evidence of acute infarction. No hydrocephalus. No intraparenchymal hemorrhage. Mild low-density changes within the periventricular and subcortical white matter compatible with chronic microvascular ischemic change. Mild diffuse cerebral volume loss. Vascular: Atherosclerotic calcifications involving the large vessels of the skull base. No unexpected hyperdense vessel. Skull: Negative for acute calvarial fracture. Sinuses/Orbits: No acute finding. Other: Scalp soft tissue swelling overlying the left  occipital region. IMPRESSION: 1. Acute subdural hematoma overlying the left cerebral convexity measuring up to 9 mm in maximal thickness. Approximately 2 mm of left-to-right midline shift. 2. Scalp soft tissue swelling overlying the left occipital region. No underlying calvarial fracture. 3. Chronic microvascular ischemic change and cerebral volume loss. Critical Value/emergent results were called by telephone at the time of interpretation on 05/29/2020 at 1:02 pm to ED provider Dr. Dina Rich, who verbally acknowledged these results. Electronically Signed   By: Davina Poke D.O.   On: 05/29/2020 13:05    Procedures .Marland KitchenLaceration Repair  Date/Time: 05/29/2020 1:33 PM Performed by: Fransico Meadow,  PA-C Authorized by: Fransico Meadow, PA-C   Consent:    Consent obtained:  Verbal   Consent given by:  Patient   Risks, benefits, and alternatives were discussed: yes     Risks discussed:  Infection   Alternatives discussed:  No treatment Universal protocol:    Procedure explained and questions answered to patient or proxy's satisfaction: yes     Immediately prior to procedure, a time out was called: yes     Patient identity confirmed:  Verbally with patient Anesthesia:    Anesthesia method:  Local infiltration   Local anesthetic:  Lidocaine 1% w/o epi Laceration details:    Location:  Scalp   Scalp location:  Occipital   Length (cm):  2 Pre-procedure details:    Preparation:  Patient was prepped and draped in usual sterile fashion Exploration:    Imaging outcome: foreign body not noted     Contaminated: no   Treatment:    Area cleansed with:  Povidone-iodine   Debridement:  None Skin repair:    Repair method:  Staples   Number of staples:  5 Post-procedure details:    Procedure completion:  Tolerated well, no immediate complications     Medications Ordered in ED Medications  lidocaine HCl (PF) (XYLOCAINE) 2 % injection 10 mL (has no administration in time range)  lidocaine (PF)  (XYLOCAINE) 1 % injection (has no administration in time range)    ED Course  I have reviewed the triage vital signs and the nursing notes.  Pertinent labs & imaging results that were available during my care of the patient were reviewed by me and considered in my medical decision making (see chart for details).    MDM Rules/Calculators/A&P                          MDM:  Radiologist called and advised pt has a 62mm subdural.  Pt and daughter counseled on results and treatment  I spoke with Neurosurgery  Dr. Saintclair Halsted who will admit to [facility] for observation)   Final Clinical Impression(s) / ED Diagnoses Final diagnoses:  Subdural hematoma ([place])  Laceration of occipital region of scalp, initial encounter    Rx / DC Orders ED Discharge Orders    None       Fransico Meadow, PA-C 05/29/20 1504    Horton, Alvin Critchley, DO 05/29/20 1511

## 2020-05-29 NOTE — ED Triage Notes (Signed)
Pt had a syncopal episode this morning and hit his head.  Pt states he is on aspirin and no other blood thinners.  Pt has a hematoma to the posterior head.

## 2020-05-30 ENCOUNTER — Observation Stay (HOSPITAL_COMMUNITY): Payer: Medicare HMO

## 2020-05-30 NOTE — Discharge Summary (Signed)
Physician Discharge Summary  Patient ID: Brett Bryant MRN: [id number] DOB/AGE: 85-Aug-1935 85 y.o.  Admit date: 05/29/2020 Discharge date: 05/30/2020  Admission Diagnoses: SDH   Discharge Diagnoses: same   Discharged Condition: good  Hospital Course: The patient was admitted on 05/29/2020 and transferred from [facility] to [facility]. The hospital course was routine. There were no complications. Pt had appropriate headaches. The patient remained afebrile with stable vital signs, and tolerated a regular diet. The patient continued to increase activities, and pain was well controlled with oral pain medications.   Consults: None  Significant Diagnostic Studies:  Results for orders placed or performed during the hospital encounter of 05/29/20  Resp Panel by RT-PCR (Flu A&B, Covid) Nasopharyngeal Swab   Specimen: Nasopharyngeal Swab; Nasopharyngeal(NP) swabs in vial transport medium  Result Value Ref Range   SARS Coronavirus 2 by RT PCR NEGATIVE NEGATIVE   Influenza A by PCR NEGATIVE NEGATIVE   Influenza B by PCR NEGATIVE NEGATIVE  MRSA PCR Screening   Specimen: Nasal Mucosa; Nasopharyngeal  Result Value Ref Range   MRSA by PCR NEGATIVE NEGATIVE  CBC with Differential/Platelet  Result Value Ref Range   WBC 9.5 4.0 - 10.5 K/uL   RBC 4.17 (L) 4.22 - 5.81 MIL/uL   Hemoglobin 13.6 13.0 - 17.0 g/dL   HCT 41.6 39.0 - 52.0 %   MCV 99.8 80.0 - 100.0 fL   MCH 32.6 26.0 - 34.0 pg   MCHC 32.7 30.0 - 36.0 g/dL   RDW 12.7 11.5 - 15.5 %   Platelets 163 150 - 400 K/uL   nRBC 0.0 0.0 - 0.2 %   Neutrophils Relative % 88 %   Neutro Abs 8.3 (H) 1.7 - 7.7 K/uL   Lymphocytes Relative 5 %   Lymphs Abs 0.5 (L) 0.7 - 4.0 K/uL   Monocytes Relative 7 %   Monocytes Absolute 0.7 0.1 - 1.0 K/uL   Eosinophils Relative 0 %   Eosinophils Absolute 0.0 0.0 - 0.5 K/uL   Basophils Relative 0 %   Basophils Absolute 0.0 0.0 - 0.1 K/uL   Immature Granulocytes 0 %   Abs Immature Granulocytes 0.04 0.00 - 0.07 K/uL   Comprehensive metabolic panel  Result Value Ref Range   Sodium 133 (L) 135 - 145 mmol/L   Potassium 4.7 3.5 - 5.1 mmol/L   Chloride 99 98 - 111 mmol/L   CO2 25 22 - 32 mmol/L   Glucose, Bld 159 (H) 70 - 99 mg/dL   BUN 15 8 - 23 mg/dL   Creatinine, Ser 0.90 0.61 - 1.24 mg/dL   Calcium 10.6 (H) 8.9 - 10.3 mg/dL   Total Protein 7.6 6.5 - 8.1 g/dL   Albumin 4.1 3.5 - 5.0 g/dL   AST 57 (H) 15 - 41 U/L   ALT 28 0 - 44 U/L   Alkaline Phosphatase 89 38 - 126 U/L   Total Bilirubin 0.7 0.3 - 1.2 mg/dL   GFR, Estimated >60 >60 mL/min   Anion gap 9 5 - 15  Urinalysis, Routine w reflex microscopic Urine, Random  Result Value Ref Range   Color, Urine YELLOW YELLOW   APPearance CLEAR CLEAR   Specific Gravity, Urine 1.013 1.005 - 1.030   pH 7.0 5.0 - 8.0   Glucose, UA NEGATIVE NEGATIVE mg/dL   Hgb urine dipstick NEGATIVE NEGATIVE   Bilirubin Urine NEGATIVE NEGATIVE   Ketones, ur NEGATIVE NEGATIVE mg/dL   Protein, ur NEGATIVE NEGATIVE mg/dL   Nitrite NEGATIVE NEGATIVE   Leukocytes,Ua NEGATIVE  NEGATIVE  Troponin I (High Sensitivity)  Result Value Ref Range   Troponin I (High Sensitivity) 12 <18 ng/L  Troponin I (High Sensitivity)  Result Value Ref Range   Troponin I (High Sensitivity) 13 <18 ng/L    CT HEAD WO CONTRAST  Result Date: 05/30/2020 CLINICAL DATA:  Follow-up subdural hematoma EXAM: CT HEAD WITHOUT CONTRAST TECHNIQUE: Contiguous axial images were obtained from the base of the skull through the vertex without intravenous contrast. COMPARISON:  05/29/2020 FINDINGS: Brain: Left parietal subdural hematoma 7 mm, slightly improved. Small amount of left tentorial subdural hematoma and interhemispheric subdural hematoma, unchanged. No new hemorrhage. Generalized atrophy. Slight midline shift to the right is unchanged. Negative for hydrocephalus. Patchy white matter hypodensity bilaterally unchanged. Hypodensity left cerebellum unchanged. Negative for acute infarct or mass. Vascular:  Negative for hyperdense vessel Skull: Negative Sinuses/Orbits: Mild mucosal edema paranasal sinuses. Negative orbit Other: None IMPRESSION: 7 mm left temporoparietal subdural hematoma appears slightly smaller. Small interhemispheric and left tentorial subdural hematoma unchanged. No new hemorrhage Atrophy and chronic small vessel ischemic changes, stable. Electronically Signed   By: Franchot Gallo M.D.   On: 05/30/2020 10:51   CT Head Wo Contrast  Result Date: 05/29/2020 CLINICAL DATA:  Syncope with fall and posterior head trauma EXAM: CT HEAD WITHOUT CONTRAST TECHNIQUE: Contiguous axial images were obtained from the base of the skull through the vertex without intravenous contrast. COMPARISON:  None. FINDINGS: Brain: Acute subdural hematoma overlying the left cerebral convexity measuring 9 mm in maximal thickness. Approximately 2 mm of left-to-right midline shift. No evidence of acute infarction. No hydrocephalus. No intraparenchymal hemorrhage. Mild low-density changes within the periventricular and subcortical white matter compatible with chronic microvascular ischemic change. Mild diffuse cerebral volume loss. Vascular: Atherosclerotic calcifications involving the large vessels of the skull base. No unexpected hyperdense vessel. Skull: Negative for acute calvarial fracture. Sinuses/Orbits: No acute finding. Other: Scalp soft tissue swelling overlying the left occipital region. IMPRESSION: 1. Acute subdural hematoma overlying the left cerebral convexity measuring up to 9 mm in maximal thickness. Approximately 2 mm of left-to-right midline shift. 2. Scalp soft tissue swelling overlying the left occipital region. No underlying calvarial fracture. 3. Chronic microvascular ischemic change and cerebral volume loss. Critical Value/emergent results were called by telephone at the time of interpretation on 05/29/2020 at 1:02 pm to ED provider Dr. Dina Rich, who verbally acknowledged these results. Electronically Signed    By: Davina Poke D.O.   On: 05/29/2020 13:05    Antibiotics:  Anti-infectives (From admission, onward)   None      Discharge Exam: Blood pressure (!) 161/72, pulse 61, temperature 98 F (36.7 C), temperature source Oral, resp. rate (!) 21, height 5\' 8"  (1.727 m), weight 87.1 kg, SpO2 95 %. Neurologic: Grossly normal Ambulating well, neuro intact  Discharge Medications:   Allergies as of 05/30/2020   No Known Allergies     Medication List    TAKE these medications   aspirin 81 MG tablet Take 81 mg by mouth daily.   CO Q 10 PO Take 200 mg by mouth daily.   metoprolol succinate 25 MG 24 hr tablet Commonly known as: TOPROL-XL Take 25 mg by mouth daily.   milk thistle 175 MG tablet Take 175 mg by mouth.   omeprazole 20 MG capsule Commonly known as: PRILOSEC Take 20 mg by mouth daily as needed (heartburn).   TURMERIC CURCUMIN PO Take 500 mg by mouth daily.   vitamin B-12 500 MCG tablet Commonly known as: CYANOCOBALAMIN Take  500 mcg by mouth daily.   Vitamin D3 125 MCG (5000 UT) Tabs Take 1,000 Units by mouth daily.   vitamin E 180 MG (400 UNITS) capsule Take 400 Units by mouth daily.       Disposition: home   Final Dx: SDH       Signed: Ocie Cornfield Meyran 05/30/2020, 3:20 PM

## 2020-05-30 NOTE — Care Management Obs Status (Signed)
[place] NOTIFICATION   Patient Details  Name: Brett Bryant MRN: [id number] Date of Birth: 1934-03-03   Medicare Observation Status Notification Given:  Yes    Ella Bodo, RN 05/30/2020, 12:19 PM

## 2020-05-30 NOTE — Care Management CC44 (Signed)
Condition Code 44 Documentation Completed  Patient Details  Name: Brett Bryant MRN: [id number] Date of Birth: [birth year]   Condition Code 44 given:  Yes Patient signature on Condition Code 44 notice:  Yes Documentation of 2 MD's agreement:  Yes Code 44 added to claim:  Yes    Ella Bodo, RN 05/30/2020, 12:19 PM

## 2020-05-30 NOTE — TOC CAGE-AID Note (Signed)
Transition of Care [facility]) - CAGE-AID Screening   Patient Details  Name: Brett Bryant MRN: [id number] Date of Birth: [birth year]  Clinical Narrative:  Patient denies any current alcohol or drug use.  CAGE-AID Screening:    Have You Ever Felt You Ought to Cut Down on Your Drinking or Drug Use?: No Have People Annoyed You By Critizing Your Drinking Or Drug Use?: No Have You Felt Bad Or Guilty About Your Drinking Or Drug Use?: No Have You Ever Had a Drink or Used Drugs First Thing In The Morning to Steady Your Nerves or to Get Rid of a Hangover?: No CAGE-AID Score: 0  Substance Abuse Education Offered: No

## 2020-05-30 NOTE — H&P (Signed)
Brett Bryant is an 87 y.o. male.   HPI:  87 year old male presented to [facility] yesterday after sustaining a fall when he woke up that morning.  Pretty amnestic to the event and does not remember what happened right before.  All he remembers is waking up after losing consciousness.  Denies any headache nausea vomiting or vision changes.  He did have a knot on his head.  Family had him go to the hospital.  He takes 81 mg baby aspirin.  Overall fairly healthy with just hypertension and reflux.  Lives at home independently.  He was transferred to [facility] after CT head was done at [facility] which showed a left subdural hematoma about 9 mm with 2 mm of left-to-right shift.  No mass-effect.  Past Medical History:  Diagnosis Date  . Barrett's esophagus 12/15/2015  . GERD (gastroesophageal reflux disease)   . Hypertension     Past Surgical History:  Procedure Laterality Date  . COLONOSCOPY    . LIVER BIOPSY     Several Years ago per patient  . UPPER GASTROINTESTINAL ENDOSCOPY      No Known Allergies  Social History   Tobacco Use  . Smoking status: Never Smoker  . Smokeless tobacco: Never Used  Substance Use Topics  . Alcohol use: No    Family History  Problem Relation Age of Onset  . Alzheimer's disease Sister   . Healthy Sister   . Heart disease Sister   . Healthy Brother   . Heart disease Brother   . Kidney disease Brother   . Heart disease Brother   . Healthy Daughter      Review of Systems  Positive ROS: As above  All other systems have been reviewed and were otherwise negative with the exception of those mentioned in the HPI and as above.  Objective: Vital signs in last 24 hours: Temp:  [97.5 F (36.4 C)-98.4 F (36.9 C)] 98.4 F (36.9 C) (03/14 0825) Pulse Rate:  [56-106] 75 (03/14 0825) Resp:  [13-26] 20 (03/14 0825) BP: (129-195)/(53-96) 173/90 (03/14 0825) SpO2:  [96 %-100 %] 98 % (03/14 0825) Weight:  [87.1 kg] 87.1 kg (03/13 1205)  General  Appearance: Alert, cooperative, no distress, appears stated age Head: Normocephalic, without obvious abnormality, atraumatic Eyes: PERRL, conjunctiva/corneas clear, EOM's intact, fundi benign, both eyes      Lungs:  respirations unlabored Heart: Regular rate and rhythm Pulses: 2+ and symmetric all extremities Skin: Skin color, texture, turgor normal, no rashes or lesions  NEUROLOGIC:   Mental status: A&O x4, no aphasia, good attention span, Memory and fund of knowledge Motor Exam - grossly normal, normal tone and bulk Sensory Exam - grossly normal Reflexes: symmetric, no pathologic reflexes, No Hoffman's, No clonus Coordination - grossly normal Gait -not tested Balance -tested Cranial Nerves: I: smell Not tested  II: visual acuity  OS: na    OD: na  II: visual fields Full to confrontation  II: pupils Equal, round, reactive to light  III,VII: ptosis None  III,IV,VI: extraocular muscles  Full ROM  V: mastication Normal  V: facial light touch sensation  Normal  V,VII: corneal reflex  Present  VII: facial muscle function - upper  Normal  VII: facial muscle function - lower Normal  VIII: hearing Not tested  IX: soft palate elevation  Normal  IX,X: gag reflex Present  XI: trapezius strength  5/5  XI: sternocleidomastoid strength 5/5  XI: neck flexion strength  5/5  XII: tongue strength  Normal    Data Review Lab Results  Component Value Date   WBC 9.5 05/29/2020   HGB 13.6 05/29/2020   HCT 41.6 05/29/2020   MCV 99.8 05/29/2020   PLT 163 05/29/2020   Lab Results  Component Value Date   NA 133 (L) 05/29/2020   K 4.7 05/29/2020   CL 99 05/29/2020   CO2 25 05/29/2020   BUN 15 05/29/2020   CREATININE 0.90 05/29/2020   GLUCOSE 159 (H) 05/29/2020   No results found for: INR, PROTIME  Radiology: CT Head Wo Contrast  Result Date: 05/29/2020 CLINICAL DATA:  Syncope with fall and posterior head trauma EXAM: CT HEAD WITHOUT CONTRAST TECHNIQUE: Contiguous axial images were  obtained from the base of the skull through the vertex without intravenous contrast. COMPARISON:  None. FINDINGS: Brain: Acute subdural hematoma overlying the left cerebral convexity measuring 9 mm in maximal thickness. Approximately 2 mm of left-to-right midline shift. No evidence of acute infarction. No hydrocephalus. No intraparenchymal hemorrhage. Mild low-density changes within the periventricular and subcortical white matter compatible with chronic microvascular ischemic change. Mild diffuse cerebral volume loss. Vascular: Atherosclerotic calcifications involving the large vessels of the skull base. No unexpected hyperdense vessel. Skull: Negative for acute calvarial fracture. Sinuses/Orbits: No acute finding. Other: Scalp soft tissue swelling overlying the left occipital region. IMPRESSION: 1. Acute subdural hematoma overlying the left cerebral convexity measuring up to 9 mm in maximal thickness. Approximately 2 mm of left-to-right midline shift. 2. Scalp soft tissue swelling overlying the left occipital region. No underlying calvarial fracture. 3. Chronic microvascular ischemic change and cerebral volume loss. Critical Value/emergent results were called by telephone at the time of interpretation on 05/29/2020 at 1:02 pm to ED provider Dr. Dina Rich, who verbally acknowledged these results. Electronically Signed   By: Davina Poke D.O.   On: 05/29/2020 13:05     Assessment/Plan: 87 year old male presented to the hospital after sustaining a fall at home.  CT head shows a left subdural hematoma 9 mm with 2 mm of left-to-right shift.  Fair amount of brain atrophy.  He is very stable with no new neurological deficits.  He is very eager to get home because he has people at his house.  We will repeat his head CT this morning and see how he does throughout the day.   Ocie Cornfield [facility] 05/30/2020 9:55 AM

## 2020-06-22 ENCOUNTER — Other Ambulatory Visit: Payer: Self-pay | Admitting: Student

## 2020-06-22 ENCOUNTER — Other Ambulatory Visit (HOSPITAL_COMMUNITY): Payer: Self-pay | Admitting: Student

## 2020-06-22 DIAGNOSIS — S065XAA Traumatic subdural hemorrhage with loss of consciousness status unknown, initial encounter: Secondary | ICD-10-CM

## 2020-06-22 DIAGNOSIS — S065X9A Traumatic subdural hemorrhage with loss of consciousness of unspecified duration, initial encounter: Secondary | ICD-10-CM

## 2020-06-27 ENCOUNTER — Ambulatory Visit (HOSPITAL_COMMUNITY)
Admission: RE | Admit: 2020-06-27 | Discharge: 2020-06-27 | Disposition: A | Discharge status: Home / Self Care | Payer: Medicare HMO | Source: Ambulatory Visit | Attending: Student | Admitting: Student

## 2020-06-27 DIAGNOSIS — S065X9A Traumatic subdural hemorrhage with loss of consciousness of unspecified duration, initial encounter: Secondary | ICD-10-CM | POA: Insufficient documentation

## 2020-06-27 DIAGNOSIS — S065XAA Traumatic subdural hemorrhage with loss of consciousness status unknown, initial encounter: Secondary | ICD-10-CM

## 2020-07-12 DIAGNOSIS — I10 Essential (primary) hypertension: Secondary | ICD-10-CM | POA: Insufficient documentation

## 2020-07-12 DIAGNOSIS — Z6828 Body mass index (BMI) 28.0-28.9, adult: Secondary | ICD-10-CM | POA: Insufficient documentation

## 2020-07-24 ENCOUNTER — Encounter (HOSPITAL_COMMUNITY): Payer: Self-pay | Admitting: *Deleted

## 2020-07-24 ENCOUNTER — Other Ambulatory Visit: Payer: Self-pay

## 2020-07-24 ENCOUNTER — Emergency Department (HOSPITAL_COMMUNITY)
Admission: EM | Admit: 2020-07-24 | Discharge: 2020-07-24 | Disposition: A | Discharge status: Home / Self Care | Payer: Medicare HMO | Attending: Emergency Medicine | Admitting: Emergency Medicine

## 2020-07-24 ENCOUNTER — Emergency Department (HOSPITAL_COMMUNITY): Payer: Medicare HMO

## 2020-07-24 DIAGNOSIS — Z79899 Other long term (current) drug therapy: Secondary | ICD-10-CM | POA: Diagnosis not present

## 2020-07-24 DIAGNOSIS — R55 Syncope and collapse: Secondary | ICD-10-CM | POA: Diagnosis not present

## 2020-07-24 DIAGNOSIS — R42 Dizziness and giddiness: Secondary | ICD-10-CM

## 2020-07-24 DIAGNOSIS — Z7982 Long term (current) use of aspirin: Secondary | ICD-10-CM | POA: Insufficient documentation

## 2020-07-24 DIAGNOSIS — I10 Essential (primary) hypertension: Secondary | ICD-10-CM | POA: Insufficient documentation

## 2020-07-24 LAB — CBC WITH DIFFERENTIAL/PLATELET
Abs Immature Granulocytes: 0.01 10*3/uL (ref 0.00–0.07)
Basophils Absolute: 0.1 10*3/uL (ref 0.0–0.1)
Basophils Relative: 1 %
Eosinophils Absolute: 0.1 10*3/uL (ref 0.0–0.5)
Eosinophils Relative: 2 %
HCT: 42.1 % (ref 39.0–52.0)
Hemoglobin: 13.8 g/dL (ref 13.0–17.0)
Immature Granulocytes: 0 %
Lymphocytes Relative: 20 %
Lymphs Abs: 1 10*3/uL (ref 0.7–4.0)
MCH: 33.3 pg (ref 26.0–34.0)
MCHC: 32.8 g/dL (ref 30.0–36.0)
MCV: 101.7 fL — ABNORMAL HIGH (ref 80.0–100.0)
Monocytes Absolute: 0.5 10*3/uL (ref 0.1–1.0)
Monocytes Relative: 10 %
Neutro Abs: 3.5 10*3/uL (ref 1.7–7.7)
Neutrophils Relative %: 67 %
Platelets: 172 10*3/uL (ref 150–400)
RBC: 4.14 MIL/uL — ABNORMAL LOW (ref 4.22–5.81)
RDW: 13.2 % (ref 11.5–15.5)
WBC: 5.2 10*3/uL (ref 4.0–10.5)
nRBC: 0 % (ref 0.0–0.2)

## 2020-07-24 LAB — BASIC METABOLIC PANEL
Anion gap: 7 (ref 5–15)
BUN: 10 mg/dL (ref 8–23)
CO2: 27 mmol/L (ref 22–32)
Calcium: 11 mg/dL — ABNORMAL HIGH (ref 8.9–10.3)
Chloride: 99 mmol/L (ref 98–111)
Creatinine, Ser: 0.86 mg/dL (ref 0.61–1.24)
GFR, Estimated: >60 mL/min (ref >60)
Glucose, Bld: 112 mg/dL — ABNORMAL HIGH (ref 70–99)
Potassium: 4.3 mmol/L (ref 3.5–5.1)
Sodium: 133 mmol/L — ABNORMAL LOW (ref 135–145)

## 2020-07-24 LAB — MAGNESIUM: Magnesium: 1.9 mg/dL (ref 1.7–2.4)

## 2020-07-24 MED ORDER — LACTATED RINGERS IV BOLUS
1000.0000 mL | Freq: Once | INTRAVENOUS | Status: AC
  Administered 2020-07-24: 1000 mL via INTRAVENOUS

## 2020-07-24 NOTE — ED Provider Notes (Signed)
[facility] EMERGENCY DEPARTMENT Provider Note   CSN: [id number] Arrival date & time: 07/24/20  1003     History Chief Complaint  Patient presents with  . Hypertension    Brett Bryant is a 87 y.o. male who presents with concern for elevated blood pressure at home.  Denies any chest pain, shortness of breath, palpitation, headache, blurry vision, double vision, lightheadedness.  He does endorse an episode of syncope yesterday as well as one episode last week for which she was seen by his primary care provider.  Patient recently underwent transition of his blood pressure medication from valsartan HCTZ to metoprolol.  Patient did not tolerate this transition well and was just transitioned back to losartan HCTZ 4 days ago.  States that his blood pressure has been mildly elevated since that time when he came in this morning due to concern for elevated blood pressure as well as episode of syncope yesterday.  He denies any head trauma, blurry vision, depression, nausea, vomiting since the fall.  I personally reviewed this patient's medical records.  He has history of cirrhosis, GERD, Barrett's esophagus, and recently was hospitalized with subdural hematoma after a syncopal episode at home.  Patient is not on any anticoagulation but is taking baby aspirin daily.  HPI     Past Medical History:  Diagnosis Date  . Barrett's esophagus 12/15/2015  . GERD (gastroesophageal reflux disease)   . Hypertension     Patient Active Problem List   Diagnosis Date Noted  . Subdural hematoma ([place]) 05/29/2020  . Barrett's esophagus 12/15/2015  . 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 GERD (gastroesophageal reflux disease) 10/15/2011  . History of colonic polyps 10/15/2011  . Hypertension 10/15/2011  . Hernia, umbilical 85/88/5027    Past Surgical History:  Procedure Laterality Date  . COLONOSCOPY    . LIVER BIOPSY     Several Years ago per patient  . UPPER GASTROINTESTINAL ENDOSCOPY          Family History  Problem Relation Age of Onset  . Alzheimer's disease Sister   . Healthy Sister   . Heart disease Sister   . Healthy Brother   . Heart disease Brother   . Kidney disease Brother   . Heart disease Brother   . Healthy Daughter     Social History   Tobacco Use  . Smoking status: Never Smoker  . Smokeless tobacco: Never Used  Vaping Use  . Vaping Use: Never used  Substance Use Topics  . Alcohol use: No  . Drug use: No    Home Medications Prior to Admission medications   Medication Sig Start Date End Date Taking? Authorizing Provider  aspirin 81 MG tablet Take 81 mg by mouth daily.    [provider]  Cholecalciferol (VITAMIN D3) 125 MCG (5000 UT) TABS Take 1,000 Units by mouth daily.    [provider]  Coenzyme Q10 (CO Q 10 PO) Take 200 mg by mouth daily.    [provider]  metoprolol succinate (TOPROL-XL) 25 MG 24 hr tablet Take 25 mg by mouth daily. 05/22/20   [provider]  milk thistle 175 MG tablet Take 175 mg by mouth.    [provider]  omeprazole (PRILOSEC) 20 MG capsule Take 20 mg by mouth daily as needed (heartburn).    [provider]  TURMERIC CURCUMIN PO Take 500 mg by mouth daily.    [provider]  vitamin B-12 (CYANOCOBALAMIN) 500 MCG tablet Take 500 mcg by mouth daily.  [provider]  vitamin E 400 UNIT capsule Take 400 Units by mouth daily.    [provider]    Allergies    Patient has no known allergies.  Review of Systems   Review of Systems  Constitutional: Negative.   HENT: Negative.   Eyes: Negative.  Negative for visual disturbance.  Respiratory: Negative.   Cardiovascular: Negative.   Gastrointestinal: Negative.   Genitourinary: Negative.   Musculoskeletal: Negative.   Skin: Negative.   Allergic/Immunologic: Negative.   Neurological: Positive for syncope and light-headedness. Negative for dizziness, tremors, seizures, facial  asymmetry, speech difficulty, weakness, numbness and headaches.    Physical Exam Updated Vital Signs BP (!) 154/83   Pulse 60   Temp 97.7 F (36.5 C) (Oral)   Resp 20   Ht 5\' 8"  (1.727 m)   Wt 81.2 kg   SpO2 99%   BMI 27.22 kg/m   Physical Exam Vitals and nursing note reviewed.  Constitutional:      Appearance: He is overweight. He is not ill-appearing or toxic-appearing.  HENT:     Head: Normocephalic and atraumatic.     Comments: No step-offs, hematomas, or lacerations    Nose: Nose normal.     Mouth/Throat:     Mouth: Mucous membranes are moist.     Pharynx: Oropharynx is clear. Uvula midline. No oropharyngeal exudate, posterior oropharyngeal erythema or uvula swelling.     Tonsils: No tonsillar exudate.  Eyes:     General: Lids are normal. Vision grossly intact.        Right eye: No discharge.        Left eye: No discharge.     Extraocular Movements: Extraocular movements intact.     Conjunctiva/sclera: Conjunctivae normal.     Pupils: Pupils are equal, round, and reactive to light.  Neck:     Trachea: Trachea and phonation normal.  Cardiovascular:     Rate and Rhythm: Normal rate and regular rhythm.     Pulses: Normal pulses.     Heart sounds: Normal heart sounds. No murmur heard.   Pulmonary:     Effort: Pulmonary effort is normal. No tachypnea, bradypnea, accessory muscle usage, prolonged expiration or respiratory distress.     Breath sounds: Normal breath sounds. No wheezing or rales.  Chest:     Chest wall: No mass, lacerations, deformity, swelling, tenderness, crepitus or edema.  Abdominal:     General: Bowel sounds are normal. There is no distension.     Palpations: Abdomen is soft.     Tenderness: There is no abdominal tenderness. There is no right CVA tenderness, left CVA tenderness, guarding or rebound.  Musculoskeletal:        General: No deformity.     Cervical back: Normal range of motion and neck supple. No crepitus. No pain with movement,  spinous process tenderness or muscular tenderness.     Right lower leg: No edema.     Left lower leg: No edema.  Lymphadenopathy:     Cervical: No cervical adenopathy.  Skin:    General: Skin is warm and dry.     Capillary Refill: Capillary refill takes less than 2 seconds.  Neurological:     General: No focal deficit present.     Mental Status: He is alert and oriented to person, place, and time. Mental status is at baseline.     Sensory: Sensation is intact.     Motor: Motor function is intact.  Psychiatric:  Mood and Affect: Mood normal.     ED Results / Procedures / Treatments   Labs (all labs ordered are listed, but only abnormal results are displayed) Labs Reviewed  BASIC METABOLIC PANEL - Abnormal; Notable for the following components:      Result Value   Sodium 133 (*)    Glucose, Bld 112 (*)    Calcium 11.0 (*)    All other components within normal limits  CBC WITH DIFFERENTIAL/PLATELET - Abnormal; Notable for the following components:   RBC 4.14 (*)    MCV 101.7 (*)    All other components within normal limits  MAGNESIUM    EKG EKG Interpretation  Date/Time:  Sunday Jul 24 2020 11:23:39 EDT Ventricular Rate:  80 PR Interval:  243 QRS Duration: 93 QT Interval:  361 QTC Calculation: 417 R Axis:   -69 Text Interpretation: Sinus rhythm Prolonged PR interval Inferior infarct, old Consider anterior infarct No significant change since last tracing Confirmed by Zackowski, Scott ([id number]) on 07/24/2020 12:37:40 PM   Radiology CT Head Wo Contrast  Result Date: 07/24/2020 CLINICAL DATA:  Syncope.  Recent history of a subdural hemorrhage. EXAM: CT HEAD WITHOUT CONTRAST TECHNIQUE: Contiguous axial images were obtained from the base of the skull through the vertex without intravenous contrast. COMPARISON:  06/27/2020. FINDINGS: Brain: No evidence of acute infarction, hemorrhage, hydrocephalus, extra-axial collection or mass lesion/mass effect. No residual subdural  hemorrhage/collection. Stable ventricular and sulcal enlargement. Patchy areas of white matter hypoattenuation are noted consistent with mild chronic microvascular ischemic change, also stable. Vascular: No hyperdense vessel or unexpected calcification. Skull: Normal. Negative for fracture or focal lesion. Sinuses/Orbits: Globes and orbits are unremarkable. Visualized sinuses are clear. Other: None. IMPRESSION: 1. No acute intracranial abnormalities. 2. Stable volume loss and mild chronic microvascular ischemic change. 3. No residual subdural collection. Electronically Signed   By: David  Ormond M.D.   On: 07/24/2020 12:03    Procedures Procedures   Medications Ordered in ED Medications  lactated ringers bolus 1,000 mL (1,000 mLs Intravenous New Bag/Given 07/24/20 1318)    ED Course  I have reviewed the triage vital signs and the nursing notes.  Pertinent labs & imaging results that were available during my care of the patient were reviewed by me and considered in my medical decision making (see chart for details).    MDM Rules/Calculators/A&P                          86  year old male presents with concern for syncopal episode yesterday as well as mildly elevated blood pressure at home.  Recently transition to new blood pressure medication.  The differential for syncope is extensive and includes, but is not limited to: arrythmia (Vtach, SVT, SSS, sinus arrest, AV block, bradycardia), aortic stenosis, AMI, hypertrophic obstructive cardiomyopathy (HOCM), PE, atrial myxoma, pulmonary hypertension, orthostatic hypotension, hypovolemia, drug effect, GB syndrome, micturition, cough, carotid sinus sensitivity, seizure, TIA/CVA, hypoglycemia, vertigo.  Hypertensive on intake to 181/89.  Vital signs otherwise normal.  Cardiopulmonary exam is normal, abdominal exam is benign.  Patient is neurologically intact without focal deficit on exam.  We will proceed with basic laboratory studies as well as CT of  the head given recent episode of subdural hemorrhage and syncopal episode yesterday that was unwitnessed.  CBC unremarkable, BMP with mild hyponatremia 133, otherwise unremarkable.  Magnesium is normal.  EKG without STEMI and unchanged from prior, no arrhythmia.  CT head negative for acute intracranial abnormality, Interval resolution of  subdural hemorrhage/collection.  Or significant orthostasis on evaluation of orthostatic blood pressures, will provide patient with a 1 L fluid bolus and reevaluate vital signs.  If normal patient will be able to be discharged home with close PCP follow-up.  Intervention required for pressure at this time, BP 154/83.  Patient likely still adjusting to new blood pressure medications, PCP to manage.  Improvement in orthostasis following fluid bolus.  No further work-up warranted in ED at this time.  Erma and his daughter who was at the bedside voiced understanding with medical evaluation and treatment plan.  Each of their questions answered to their expressed inspection.  Return precautions given.  Patient is well-appearing, stable, and appropriate for discharge at this time.  This chart was dictated using voice recognition software, Dragon. Despite the best efforts of this provider to proofread and correct errors, errors may still occur which can change documentation meaning.   Final Clinical Impression(s) / ED Diagnoses Final diagnoses:  None    Rx / DC Orders ED Discharge Orders    None       Emeline Darling, PA-C 07/24/20 1431    Fredia Sorrow, MD 07/25/20 [phone]

## 2020-07-24 NOTE — Discharge Instructions (Addendum)
You were seen in the ER today for his blood pressure.  Physical exam, vital signs, blood work, and CT scan were very reassuring.  There is no further bleeding in his brain and the bleeding that was there prior has resolved at this time.  You were found to have a significant decrease in your blood pressure with transition from sitting to standing, called orthostatic hypotension.  While your blood pressure reading remained within normal limits, there was a significant decrease in your blood pressure with transition from lying to standing.  For this reason you were administered IV fluids with improvement in your blood pressure.  Suspect your orthostatic hypotension may have contributed to your episode of syncope last night.  Recommend increase hydration at home and close follow-up with your primary care doctor.  Additionally below is contact information for the cardiologist here in [place], for you to follow-up given recurring syncopal episodes.  Return to the emergency department if you develop any chest pain, shortness of breath, syncopized is again, or develops any other new severe symptoms.

## 2020-07-24 NOTE — ED Triage Notes (Signed)
Pt reports he is on blood pressure medication and it was changed 3-4 days ago. Pt reports his BP was doing better for the next day or so and then today it has started to go back up. Pt reports he passed out Thursday after the change in medication.

## 2020-07-24 NOTE — ED Notes (Signed)
Patient transported to CT 

## 2020-09-06 ENCOUNTER — Ambulatory Visit: Payer: Medicare HMO | Admitting: Urology

## 2020-09-13 ENCOUNTER — Ambulatory Visit: Payer: Medicare HMO | Admitting: Urology

## 2020-10-18 ENCOUNTER — Other Ambulatory Visit: Payer: Self-pay

## 2020-10-18 ENCOUNTER — Ambulatory Visit: Payer: Medicare HMO | Admitting: Urology

## 2020-10-18 ENCOUNTER — Encounter: Payer: Self-pay | Admitting: Urology

## 2020-10-18 VITALS — BP 171/76 | HR 93

## 2020-10-18 DIAGNOSIS — R972 Elevated prostate specific antigen [PSA]: Secondary | ICD-10-CM | POA: Diagnosis not present

## 2020-10-18 LAB — URINALYSIS, ROUTINE W REFLEX MICROSCOPIC
Bilirubin, UA: NEGATIVE
Glucose, UA: NEGATIVE
Ketones, UA: NEGATIVE
Leukocytes,UA: NEGATIVE
Nitrite, UA: NEGATIVE
Protein,UA: NEGATIVE
RBC, UA: NEGATIVE
Specific Gravity, UA: 1.015 (ref 1.005–1.030)
Urobilinogen, Ur: 0.2 mg/dL (ref 0.2–1.0)
pH, UA: 7 (ref 5.0–7.5)

## 2020-10-18 NOTE — Progress Notes (Signed)

## 2020-10-18 NOTE — Addendum Note (Signed)
Addended byIris Pert on: 10/18/2020 01:58 PM   Modules accepted: Orders

## 2020-10-18 NOTE — Progress Notes (Signed)
History of Present Illness: This Brett Bryant returns for follow-up of elevated PSA.  4.17.2018: Sent by Dr. Manuella Ghazi for elevating PSA trend. The patient denies significant urologic history or voiding symptoms. His PSA in July 2017 was 5. PSA was checked in November 2017 and was up to 6. In January 2018, PSA was 7.9. Recheck here on 07/03/2016 was 4.7.   5.21.2019:  PSA 5.8  4.30.2020: PSA 4.8 6.8.2021: PSA 8.7     12/19/2018 06/27/17 12/25/16 07/03/16 04/04/16 02/03/16 09/17/15  Total PSA 7.3 ng/dl 5.8 ng/dl 7.2 ng/dl 4.7 ng/dl 7.9 ng/dl 6.0 ng/dl 5.0 ng/dl  Free PSA     0.9 ng/dl          % Free PSA     13 %   6.8 %        8.2.2022: No real GU issues. He has fallen twice recently d/t BP med change.  No recent PSA. Past Medical History:  Diagnosis Date   Barrett's esophagus 12/15/2015   GERD (gastroesophageal reflux disease)    Hypertension     Past Surgical History:  Procedure Laterality Date   COLONOSCOPY     LIVER BIOPSY     Several Years ago per patient   UPPER GASTROINTESTINAL ENDOSCOPY      Home Medications:  Allergies as of 10/18/2020   No Known Allergies      Medication List        Accurate as of October 18, 2020  8:21 AM. If you have any questions, ask your nurse or doctor.          aspirin 81 MG tablet Take 81 mg by mouth daily.   CO Q 10 PO Take 200 mg by mouth daily.   metoprolol succinate 25 MG 24 hr tablet Commonly known as: TOPROL-XL Take 25 mg by mouth daily.   milk thistle 175 MG tablet Take 175 mg by mouth.   omeprazole 20 MG capsule Commonly known as: PRILOSEC Take 20 mg by mouth daily as needed (heartburn).   TURMERIC CURCUMIN PO Take 500 mg by mouth daily.   vitamin B-12 500 MCG tablet Commonly known as: CYANOCOBALAMIN Take 500 mcg by mouth daily.   Vitamin D3 125 MCG (5000 UT) Tabs Take 1,000 Units by mouth daily.   vitamin E 180 MG (400 UNITS) capsule Take 400 Units by mouth daily.        Allergies: No Known Allergies  Family  History  Problem Relation Age of Onset   Alzheimer's disease Sister    Healthy Sister    Heart disease Sister    Healthy Brother    Heart disease Brother    Kidney disease Brother    Heart disease Brother    Healthy Daughter     Social History:  reports that he has never smoked. He has never used smokeless tobacco. He reports that he does not drink alcohol and does not use drugs.  ROS: A complete review of systems was performed.  All systems are negative except for pertinent findings as noted.  Physical Exam:  Vital signs in last 24 hours: There were no vitals taken for this visit. Constitutional:  Alert and oriented, No acute distress Cardiovascular: Regular rate  Respiratory: Normal respiratory effor Genitourinary: Normal male phallus, testes are descended bilaterally and non-tender and without masses (atrophic), scrotum is normal in appearance without lesions or masses, perineum is normal on inspection.Prostate 60 mL, symmetric, no nodules.  Lymphatic: No lymphadenopathy Neurologic: Grossly intact, no focal deficits Psychiatric: Normal mood and  affect  I have reviewed prior pt notes  I have reviewed urinalysis results  I have reviewed prior PSA results    Impression/Assessment:  Elevated PSA.  Last check it was 8.7 which is a bit higher than his baseline.  Benign exam today  Plan:  I will recheck his PSA.  Unless significant change in the upward direction, I will just see back on an annual basis.

## 2020-10-19 LAB — PSA: Prostate Specific Ag, Serum: 13 ng/mL — ABNORMAL HIGH (ref 0.0–4.0)

## 2020-11-15 ENCOUNTER — Other Ambulatory Visit: Payer: Self-pay | Admitting: Urology

## 2020-11-15 DIAGNOSIS — R972 Elevated prostate specific antigen [PSA]: Secondary | ICD-10-CM

## 2020-11-15 MED ORDER — LEVOFLOXACIN 750 MG PO TABS: 750.0000 mg | ORAL_TABLET | Freq: Every day | ORAL | 0 refills | Status: AC

## 2020-11-15 NOTE — Progress Notes (Signed)
Notify patient-PSA now up to 13.  I would recommend performing ultrasound and biopsy.  I put orders in.  Unfortunately, I cannot get the Levaquin sent in.  Send in a 750 mg tablet.

## 2020-11-16 ENCOUNTER — Telehealth: Payer: Self-pay

## 2020-11-16 NOTE — Telephone Encounter (Signed)
Patient called. No answer. Left message to return call.

## 2020-11-16 NOTE — Telephone Encounter (Signed)
-----   Message from Franchot Gallo, MD sent at 11/15/2020  3:05 PM EDT ----- Notify patient-PSA now up to 13.  I would recommend performing ultrasound and biopsy.  I put orders in.  Unfortunately, I cannot get the Levaquin sent in.  Send in a 750 mg tablet.

## 2020-11-25 ENCOUNTER — Other Ambulatory Visit: Payer: Self-pay

## 2020-11-25 ENCOUNTER — Emergency Department (HOSPITAL_COMMUNITY): Payer: Medicare HMO

## 2020-11-25 ENCOUNTER — Observation Stay (HOSPITAL_COMMUNITY)
Admission: EM | Admit: 2020-11-25 | Discharge: 2020-11-26 | Disposition: A | Discharge status: Home / Self Care | Payer: Medicare HMO | Attending: Internal Medicine | Admitting: Internal Medicine

## 2020-11-25 ENCOUNTER — Encounter (HOSPITAL_COMMUNITY): Payer: Self-pay | Admitting: *Deleted

## 2020-11-25 DIAGNOSIS — R778 Other specified abnormalities of plasma proteins: Secondary | ICD-10-CM | POA: Diagnosis not present

## 2020-11-25 DIAGNOSIS — Z79899 Other long term (current) drug therapy: Secondary | ICD-10-CM | POA: Insufficient documentation

## 2020-11-25 DIAGNOSIS — Z7982 Long term (current) use of aspirin: Secondary | ICD-10-CM | POA: Insufficient documentation

## 2020-11-25 DIAGNOSIS — Z20822 Contact with and (suspected) exposure to covid-19: Secondary | ICD-10-CM | POA: Insufficient documentation

## 2020-11-25 DIAGNOSIS — I10 Essential (primary) hypertension: Secondary | ICD-10-CM | POA: Diagnosis not present

## 2020-11-25 DIAGNOSIS — K219 Gastro-esophageal reflux disease without esophagitis: Secondary | ICD-10-CM | POA: Diagnosis present

## 2020-11-25 DIAGNOSIS — I7121 Aneurysm of the ascending aorta, without rupture: Secondary | ICD-10-CM

## 2020-11-25 DIAGNOSIS — R718 Other abnormality of red blood cells: Secondary | ICD-10-CM | POA: Diagnosis not present

## 2020-11-25 DIAGNOSIS — R000 Tachycardia, unspecified: Secondary | ICD-10-CM | POA: Insufficient documentation

## 2020-11-25 DIAGNOSIS — R7989 Other specified abnormal findings of blood chemistry: Secondary | ICD-10-CM | POA: Diagnosis present

## 2020-11-25 DIAGNOSIS — R55 Syncope and collapse: Secondary | ICD-10-CM | POA: Diagnosis present

## 2020-11-25 DIAGNOSIS — I712 Thoracic aortic aneurysm, without rupture: Secondary | ICD-10-CM

## 2020-11-25 DIAGNOSIS — K2270 Barrett's esophagus without dysplasia: Secondary | ICD-10-CM | POA: Diagnosis present

## 2020-11-25 LAB — RESP PANEL BY RT-PCR (FLU A&B, COVID) ARPGX2
Influenza A by PCR: NEGATIVE
Influenza B by PCR: NEGATIVE
SARS Coronavirus 2 by RT PCR: NEGATIVE

## 2020-11-25 LAB — URINALYSIS, ROUTINE W REFLEX MICROSCOPIC
Bilirubin Urine: NEGATIVE
Glucose, UA: NEGATIVE mg/dL
Hgb urine dipstick: NEGATIVE
Ketones, ur: NEGATIVE mg/dL
Leukocytes,Ua: NEGATIVE
Nitrite: NEGATIVE
Protein, ur: NEGATIVE mg/dL
Specific Gravity, Urine: 1.015 (ref 1.005–1.030)
pH: 8 (ref 5.0–8.0)

## 2020-11-25 LAB — CBC
HCT: 42.3 % (ref 39.0–52.0)
Hemoglobin: 13.8 g/dL (ref 13.0–17.0)
MCH: 33.6 pg (ref 26.0–34.0)
MCHC: 32.6 g/dL (ref 30.0–36.0)
MCV: 102.9 fL — ABNORMAL HIGH (ref 80.0–100.0)
Platelets: 188 10*3/uL (ref 150–400)
RBC: 4.11 MIL/uL — ABNORMAL LOW (ref 4.22–5.81)
RDW: 13.1 % (ref 11.5–15.5)
WBC: 6 10*3/uL (ref 4.0–10.5)
nRBC: 0 % (ref 0.0–0.2)

## 2020-11-25 LAB — BASIC METABOLIC PANEL
Anion gap: 7 (ref 5–15)
BUN: 16 mg/dL (ref 8–23)
CO2: 26 mmol/L (ref 22–32)
Calcium: 11 mg/dL — ABNORMAL HIGH (ref 8.9–10.3)
Chloride: 101 mmol/L (ref 98–111)
Creatinine, Ser: 0.95 mg/dL (ref 0.61–1.24)
GFR, Estimated: >60 mL/min (ref >60)
Glucose, Bld: 117 mg/dL — ABNORMAL HIGH (ref 70–99)
Potassium: 4.3 mmol/L (ref 3.5–5.1)
Sodium: 134 mmol/L — ABNORMAL LOW (ref 135–145)

## 2020-11-25 LAB — DIFFERENTIAL
Abs Immature Granulocytes: 0.01 10*3/uL (ref 0.00–0.07)
Basophils Absolute: 0.1 10*3/uL (ref 0.0–0.1)
Basophils Relative: 1 %
Eosinophils Absolute: 0.1 10*3/uL (ref 0.0–0.5)
Eosinophils Relative: 1 %
Immature Granulocytes: 0 %
Lymphocytes Relative: 18 %
Lymphs Abs: 1.1 10*3/uL (ref 0.7–4.0)
Monocytes Absolute: 0.7 10*3/uL (ref 0.1–1.0)
Monocytes Relative: 12 %
Neutro Abs: 4.1 10*3/uL (ref 1.7–7.7)
Neutrophils Relative %: 68 %

## 2020-11-25 LAB — TROPONIN I (HIGH SENSITIVITY)
Troponin I (High Sensitivity): 36 ng/L — ABNORMAL HIGH (ref <18)
Troponin I (High Sensitivity): 47 ng/L — ABNORMAL HIGH (ref <18)
Troponin I (High Sensitivity): 90 ng/L — ABNORMAL HIGH (ref <18)

## 2020-11-25 LAB — D-DIMER, QUANTITATIVE: D-Dimer, Quant: 1.72 ug/mL-FEU — ABNORMAL HIGH (ref 0.00–0.50)

## 2020-11-25 LAB — CBG MONITORING, ED: Glucose-Capillary: 100 mg/dL — ABNORMAL HIGH (ref 70–99)

## 2020-11-25 MED ORDER — ENOXAPARIN SODIUM 40 MG/0.4ML IJ SOSY
40.0000 mg | PREFILLED_SYRINGE | INTRAMUSCULAR | Status: DC
  Administered 2020-11-25: 40 mg via SUBCUTANEOUS
  Filled 2020-11-25: qty 0.4

## 2020-11-25 MED ORDER — IRBESARTAN 150 MG PO TABS
300.0000 mg | ORAL_TABLET | Freq: Every day | ORAL | Status: DC
  Administered 2020-11-26: 300 mg via ORAL
  Filled 2020-11-25: qty 2

## 2020-11-25 MED ORDER — PANTOPRAZOLE SODIUM 40 MG PO TBEC
40.0000 mg | DELAYED_RELEASE_TABLET | Freq: Every day | ORAL | Status: DC
  Administered 2020-11-26: 40 mg via ORAL
  Filled 2020-11-25: qty 1

## 2020-11-25 MED ORDER — AMLODIPINE-OLMESARTAN 5-40 MG PO TABS: 1.0000 | ORAL_TABLET | Freq: Every day | ORAL | Status: DC

## 2020-11-25 MED ORDER — AMLODIPINE BESYLATE 5 MG PO TABS
5.0000 mg | ORAL_TABLET | Freq: Every day | ORAL | Status: DC
  Administered 2020-11-26: 5 mg via ORAL
  Filled 2020-11-25: qty 1

## 2020-11-25 MED ORDER — IOHEXOL 350 MG/ML SOLN
75.0000 mL | Freq: Once | INTRAVENOUS | Status: AC | PRN
  Administered 2020-11-25: 75 mL via INTRAVENOUS

## 2020-11-25 MED ORDER — SODIUM CHLORIDE 0.9 % IV BOLUS
1000.0000 mL | Freq: Once | INTRAVENOUS | Status: AC
  Administered 2020-11-25: 1000 mL via INTRAVENOUS

## 2020-11-25 MED ORDER — SODIUM CHLORIDE 0.9 % IV SOLN: Freq: Once | INTRAVENOUS | Status: AC

## 2020-11-25 MED ORDER — HYDRALAZINE HCL 25 MG PO TABS
25.0000 mg | ORAL_TABLET | Freq: Once | ORAL | Status: DC
Start: 2020-11-25 — End: 2020-11-26

## 2020-11-25 NOTE — ED Triage Notes (Signed)
Syncopal episodes x 2 today

## 2020-11-25 NOTE — ED Provider Notes (Signed)
[facility] EMERGENCY DEPARTMENT Provider Note   CSN: [id number] Arrival date & time: 11/25/20  1507     History Chief Complaint  Patient presents with   Near Syncope    Brett Bryant is a 87 y.o. male.  HPI  Patient presents with presyncope.  Patient had 2 episodes of presyncope today, states they happen when he was getting up real quick.  Says he felt like there was a buzzing in his head and he had to sit down or he would have passed out.  There were no vision changes, he did not lose consciousness or hit his head.  Solved by sitting down or squatting.  He is not having any pain anywhere, denies any chest pain or shortness of breath, he is not eating or drinking any less than normal, no recent changes in medication.  Reports he has had similar episodes this past year and has been evaluated for it.  Reports that initially these episodes became less frequent after lowering his blood pressure medicine, but states that they still occur infrequently.  He is not able to characterize the pattern, states they happen sometimes multiple times a day and sometimes only once every few weeks.  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   GERD (gastroesophageal reflux disease) 10/15/2011   History of colonic polyps 10/15/2011   Hypertension [id number]   Hernia, umbilical [id number]    Past Surgical History:  Procedure Laterality Date   COLONOSCOPY     LIVER BIOPSY     Several Years ago per patient   UPPER GASTROINTESTINAL ENDOSCOPY         Family History  Problem Relation Age of Onset   Alzheimer's disease Sister    Healthy Sister    Heart disease Sister    Healthy Brother    Heart disease Brother    Kidney disease Brother     Heart disease Brother    Healthy Daughter     Social History   Tobacco Use   Smoking status: Never   Smokeless tobacco: Never  Vaping Use   Vaping Use: Never used  Substance Use Topics   Alcohol use: No   Drug use: No    Home Medications Prior to Admission medications   Medication Sig Start Date End Date Taking? Authorizing Provider  amLODipine-olmesartan (AZOR) 5-20 MG tablet Take 1 tablet by mouth daily. 08/09/20   [provider]  aspirin 81 MG tablet Take 81 mg by mouth daily.    [provider]  Cholecalciferol (VITAMIN D3) 125 MCG (5000 UT) TABS Take 1,000 Units by mouth daily.    [provider]  Coenzyme Q10 (CO Q 10 PO) Take 200 mg by mouth daily.    [provider]  metoprolol succinate (TOPROL-XL) 25 MG 24 hr tablet Take 25 mg by mouth daily. 05/22/20   [provider]  milk thistle 175 MG tablet Take 175 mg by mouth.    [provider]  omeprazole (PRILOSEC) 20 MG capsule Take 20 mg by mouth daily as needed (heartburn).    [provider]  TURMERIC CURCUMIN PO Take 500 mg by mouth daily.    [provider]  vitamin B-12 (CYANOCOBALAMIN) 500 MCG tablet Take 500 mcg  by mouth daily.    [provider]  vitamin E 400 UNIT capsule Take 400 Units by mouth daily.    [provider]    Allergies    Patient has no known allergies.  Review of Systems   Review of Systems  Constitutional:  Negative for fever.  Eyes:  Negative for visual disturbance.  Respiratory:  Negative for shortness of breath.   Cardiovascular:  Negative for chest pain and leg swelling.  Gastrointestinal:  Negative for nausea and vomiting.  Genitourinary:  Negative for dysuria.  Musculoskeletal:  Negative for gait problem and neck pain.  Neurological:  Positive for syncope and headaches. Negative for dizziness, weakness and numbness.   Physical Exam Updated Vital Signs BP (!) 170/99   Pulse (!) 105   Temp 98.2  F (36.8 C)   Resp 20   SpO2 98%   Physical Exam Vitals and nursing note reviewed. Exam conducted with a chaperone present.  Constitutional:      General: He is not in acute distress.    Appearance: Normal appearance.  HENT:     Head: Normocephalic and atraumatic.  Eyes:     General: No scleral icterus.       Right eye: No discharge.        Left eye: No discharge.     Extraocular Movements: Extraocular movements intact.     Pupils: Pupils are equal, round, and reactive to light.     Comments: No nystagmus  Cardiovascular:     Rate and Rhythm: Regular rhythm. Tachycardia present.     Pulses: Normal pulses.     Heart sounds: Normal heart sounds. No murmur heard.   No friction rub. No gallop.  Pulmonary:     Effort: Pulmonary effort is normal. No respiratory distress.     Breath sounds: Normal breath sounds.  Abdominal:     General: Abdomen is flat. Bowel sounds are normal. There is no distension.     Palpations: Abdomen is soft.     Tenderness: There is no abdominal tenderness.  Musculoskeletal:        General: Normal range of motion.  Skin:    General: Skin is warm and dry.     Coloration: Skin is not jaundiced.  Neurological:     Mental Status: He is alert. Mental status is at baseline.     Coordination: Coordination normal.     Comments: Patient is oriented x3.  No dysarthria, answers questions appropriately and follows two-step commands.  Cranial nerves II through XII are grossly intact, grip strength is equal bilaterally.  Patient is able to raise both lower extremities.  Patient light touch is grossly intact to the lower extremities.    ED Results / Procedures / Treatments   Labs (all labs ordered are listed, but only abnormal results are displayed) Labs Reviewed  BASIC METABOLIC PANEL  CBC  URINALYSIS, ROUTINE W REFLEX MICROSCOPIC  DIFFERENTIAL  CBG MONITORING, ED    EKG None  Radiology No results found.  Procedures Procedures   Medications Ordered  in ED Medications - No data to display  ED Course  I have reviewed the triage vital signs and the nursing notes.  Pertinent labs & imaging results that were available during my care of the patient were reviewed by me and considered in my medical decision making (see chart for details).    MDM Rules/Calculators/A&P  Patient reports for presyncope.  He has been seen for this in the past, symptoms do appear somewhat orthostatic.  He is mildly tachycardic, not having any chest pain.  He has no focal deficits on neuro exam, no nystagmus.  We will give fluid bolus given he is mildly tachycardic.  Also check orthostatic vital times and work-up for possible etiologies for presyncope.  ACS is on the table even if he is not having chest pain due to his age.  We will also check a D-dimer given that he is mildly tachycardic and having patient pre-syncopal events and could have a PE.  Patient is not hypoglycemic.  EKG is without any arrhythmias, STE or ST depression, or heart block.  No gross electrolyte derangement, no major leukocytosis or major anemia.  Urine negative for signs of UTI.   Patient has an elevated D-dimer, will go ahead with CTA to evaluate for PE.   His initial troponin is elevated at 36, will need to check a second.  Patient does not have any ST elevation or depression on the EKG or chest pain.   CTA and second troponin pending at shift change. Dr. Langston Masker has seen the patient and is aware of the workup. Please see his note for dispo. Planning for likely admission for echo given age and elevated troponin.   Final Clinical Impression(s) / ED Diagnoses Final diagnoses:  None    Rx / DC Orders ED Discharge Orders     None        Sherrill Raring, [place] 11/25/20 1839    Wyvonnia Dusky, MD 11/26/20 [phone]

## 2020-11-25 NOTE — H&P (Signed)
History and Physical  Brett Bryant DOB: 02/27/1934 DOA: 11/25/2020  Referring physician: Maxwell Marion PCP: Glenda Chroman, MD  Patient coming from: Home  Chief Complaint:  Near fainting episodes  HPI: Brett Bryant is a 87 y.o. male with medical history significant for hypertension, GERD who presents to the emergency department due to near fainting episodes which occurred twice today.  He states that symptoms occur when he gets up fast from the bed and it also occurs when he is in standing position for long time, he states that he learned to squat or sit down since this appear to improve symptoms.  He denies losing consciousness, chest pain, shortness of breath and denies recent change in medication. Patient states that symptoms 1st started in March of this year and he endorsed a syncopal episode which resulted in a fall requiring about 5 stitches on the head at the time.  Patient has seen his physician in the past, BP med was reduced with decreased frequency of symptoms.  However, there was no specific pattern to symptoms with colic or while in several weeks or occur more than once in a day.  He denies fever, chills or any distress.  ED Course:  In the emergency department, he was tachypneic and initially tachycardic.  BP was 174/117.  O2 sat was 97 to 99% on room air.  Work-up in the ED showed elevated MCV at 102.9, BMP was normal except sodium of 134, glucose 117, calcium 11.0.  Troponin x2-36 > 47.  Urinalysis was unimpressive for UTI, D-dimer 1.72.  Influenza A, B, SARS coronavirus 2 was negative. CT endograft of chest with contrast was negative for pulmonary embolism and no acute intrathoracic process noted. Chest x-ray showed no active cardiopulmonary disease IV hydration was provided and hospitalist was asked to admit patient for further evaluation and management.  Review of Systems: Constitutional: Negative for chills and fever.  HENT: Negative for ear pain and sore throat.    Eyes: Negative for pain and visual disturbance.  Respiratory: Negative for cough, chest tightness and shortness of breath.   Cardiovascular: Negative for chest pain and palpitations.  Gastrointestinal: Negative for abdominal pain and vomiting.  Endocrine: Negative for polyphagia and polyuria.  Genitourinary: Negative for decreased urine volume, dysuria, enuresis Musculoskeletal: Negative for arthralgias and back pain.  Skin: Negative for color change and rash.  Allergic/Immunologic: Negative for immunocompromised state.  Neurological: Positive for headache and near syncope.  Negative for tremors, speech difficulty Hematological: Does not bruise/bleed easily.  All other systems reviewed and are negative   Past Medical History:  Diagnosis Date   Barrett's esophagus 12/15/2015   GERD (gastroesophageal reflux disease)    Hypertension    Past Surgical History:  Procedure Laterality Date   COLONOSCOPY     LIVER BIOPSY     Several Years ago per patient   UPPER GASTROINTESTINAL ENDOSCOPY      Social History:  reports that he has never smoked. He has never used smokeless tobacco. He reports that he does not drink alcohol and does not use drugs.   No Known Allergies  Family History  Problem Relation Age of Onset   Alzheimer's disease Sister    Healthy Sister    Heart disease Sister    Healthy Brother    Heart disease Brother    Kidney disease Brother    Heart disease Brother    Healthy Daughter      Prior to Admission medications   Medication Sig Start Date End  Date Taking? Authorizing Provider  amLODipine-olmesartan (AZOR) 5-20 MG tablet Take 1 tablet by mouth daily. 08/09/20   [provider]  aspirin 81 MG EC tablet Take by mouth.    [provider]  aspirin 81 MG tablet Take 81 mg by mouth daily.    [provider]  Cholecalciferol (VITAMIN D3) 125 MCG (5000 UT) TABS Take 1,000 Units by mouth daily.    [provider]  Coenzyme Q10 (CO  Q 10 PO) Take 200 mg by mouth daily.    [provider]  hydrALAZINE (APRESOLINE) 10 MG tablet Take 10 mg by mouth 3 (three) times daily. 11/25/20   [provider]  metoprolol succinate (TOPROL-XL) 25 MG 24 hr tablet Take 25 mg by mouth daily. 05/22/20   [provider]  milk thistle 175 MG tablet Take 175 mg by mouth.    [provider]  omeprazole (PRILOSEC) 20 MG capsule Take 20 mg by mouth daily as needed (heartburn).    [provider]  omeprazole (PRILOSEC) 40 MG capsule Take by mouth.    [provider]  TURMERIC CURCUMIN PO Take 500 mg by mouth daily.    [provider]  valsartan-hydrochlorothiazide (DIOVAN-HCT) 160-12.5 MG tablet Take 1 tablet by mouth daily. 06/02/20   [provider]  vitamin B-12 (CYANOCOBALAMIN) 500 MCG tablet Take 500 mcg by mouth daily.    [provider]  vitamin E 400 UNIT capsule Take 400 Units by mouth daily.    [provider]    Physical Exam: BP (!) 161/76   Pulse 73   Temp 98.2 F (36.8 C)   Resp (!) 21   SpO2 99%   General: 87 y.o. year-old male well developed well nourished in no acute distress.  Alert and oriented x3. HEENT: NCAT, EOMI Neck: Supple, trachea medial Cardiovascular: Tachycardia.  Regular rate and rhythm with no rubs or gallops.  No thyromegaly or JVD noted.  No lower extremity edema. 2/4 pulses in all 4 extremities. Respiratory: Clear to auscultation with no wheezes or rales. Good inspiratory effort. Abdomen: Soft, nontender nondistended with normal bowel sounds x4 quadrants. Muskuloskeletal: No cyanosis, clubbing or edema noted bilaterally Neuro: CN II-XII intact, strength 5/5 x 4, sensation, reflexes intact Skin: No ulcerative lesions noted or rashes Psychiatry: Judgement and insight appear normal. Mood is appropriate for condition and setting          Labs on Admission:  Basic Metabolic Panel: Recent Labs  Lab 11/25/20 1617  NA 134*   K 4.3  CL 101  CO2 26  GLUCOSE 117*  BUN 16  CREATININE 0.95  CALCIUM 11.0*   Liver Function Tests: No results for input(s): AST, ALT, ALKPHOS, BILITOT, PROT, ALBUMIN in the last 168 hours. No results for input(s): LIPASE, AMYLASE in the last 168 hours. No results for input(s): AMMONIA in the last 168 hours. CBC: Recent Labs  Lab 11/25/20 1617  WBC 6.0  NEUTROABS 4.1  HGB 13.8  HCT 42.3  MCV 102.9*  PLT 188   Cardiac Enzymes: No results for input(s): CKTOTAL, CKMB, CKMBINDEX, TROPONINI in the last 168 hours.  BNP (last 3 results) No results for input(s): BNP in the last 8760 hours.  ProBNP (last 3 results) No results for input(s): PROBNP in the last 8760 hours.  CBG: Recent Labs  Lab 11/25/20 1706  GLUCAP 100*    Radiological Exams on Admission: DG Chest 2 View  Result Date: 11/25/2020 CLINICAL DATA:  Presyncope EXAM: CHEST - 2  VIEW COMPARISON:  None. FINDINGS: Elevation of right diaphragm. No focal consolidation, pleural effusion, or pneumothorax. Probable soft tissue artifact over left lung base. Borderline to mild cardiomegaly with aortic atherosclerosis. No visible pneumothorax. IMPRESSION: No active cardiopulmonary disease.  Borderline to mild cardiomegaly. Electronically Signed   By: Donavan Foil M.D.   On: 11/25/2020 17:19   CT Angio Chest PE W/Cm &/Or Wo Cm  Result Date: 11/25/2020 CLINICAL DATA:  Syncope.  Elevated D-dimer. EXAM: CT ANGIOGRAPHY CHEST WITH CONTRAST TECHNIQUE: Multidetector CT imaging of the chest was performed using the standard protocol during bolus administration of intravenous contrast. Multiplanar CT image reconstructions and MIPs were obtained to evaluate the vascular anatomy. CONTRAST:  4m OMNIPAQUE IOHEXOL 350 MG/ML SOLN COMPARISON:  Chest x-ray from same day. Abdominal ultrasound dated August 19, 2018. FINDINGS: Cardiovascular: Satisfactory opacification of the pulmonary arteries to the segmental level. No evidence of pulmonary embolism.  Mild cardiomegaly. No pericardial effusion. Mild aneurysmal dilatation of the ascending thoracic aorta measuring up to 4.2 cm. Coronary, aortic arch, and branch vessel atherosclerotic vascular disease. Occluded proximal left vertebral artery. Mediastinum/Nodes: No enlarged mediastinal, hilar, or axillary lymph nodes. 2.0 cm hypodense nodule in the right thyroid lobe. In the setting of significant comorbidities or limited life expectancy, no follow-up recommended (ref: J Am Coll Radiol. 2015 Feb;12(2): 143-50). Trachea and esophagus demonstrate no significant findings. Lungs/Pleura: No focal consolidation, pleural effusion, or pneumothorax. No suspicious pulmonary nodule. Upper Abdomen: No acute abnormality. Unchanged cirrhotic appearing liver. Several small gallstones again noted. Musculoskeletal: Bilateral gynecomastia. No acute or significant osseous findings. Chronic T8 compression deformity. Review of the MIP images confirms the above findings. IMPRESSION: 1. No evidence of pulmonary embolism. No acute intrathoracic process. 2. Mild aneurysmal dilatation of the ascending thoracic aorta measuring up to 4.2 cm. Recommend annual imaging followup by CTA or MRA. This recommendation follows 2010 ACCF/AHA/AATS/ACR/ASA/SCA/SCAI/SIR/STS/SVM Guidelines for the Diagnosis and Management of Patients with Thoracic Aortic Disease. Circulation. 2010; 121:[id number] Aortic aneurysm NOS (ICD10-I71.9) 3. Occluded proximal left vertebral artery, presumably chronic. 4. Unchanged cirrhosis and cholelithiasis. 5. Aortic Atherosclerosis (ICD10-I70.0). Electronically Signed   By: WTitus DubinM.D.   On: 11/25/2020 18:36    EKG: I independently viewed the EKG done and my findings are as followed: Sinus tachycardia at a rate of 106 bpm with first-degree AV block  Assessment/Plan Present on Admission:  Near syncope  GERD (gastroesophageal reflux disease)  Hypertension  Barrett's esophagus  Principal Problem:   Near  syncope Active Problems:   GERD (gastroesophageal reflux disease)   Hypertension   Barrett's esophagus   Elevated troponin   Elevated MCV   Thoracic ascending aortic aneurysm (HCC)   Near syncope Continue telemetry and watch for arrhythmias Troponins 36 > 47; continue to trend troponin EKG showed sinus tachycardia at a rate of 106 bpm with first degree A-V block  Echocardiogram will be done to rule out significant aortic stenosis or other outflow obstruction, and also to evaluate EF and to rule out segmental/Regional wall motion abnormalities.  Carotid artery Dopplers will be done to rule out hemodynamically significant stenosis  Elevated troponin possible secondary to type II demand ischemia Troponins 36 > 47; patient denies chest pain.  Continue to trend troponin  Elevated MCV MCV 102.9; vitamin B12 and folate levels will be checked  Hypercalcemia Ca 11.0, IV hydration was provided in the ED Continue IV hydration  Elevated D-dimer D-dimer 1.72; CT angiography of chest ruled out pulmonary embolism  Incidental finding of ascending aortic aneurysm CT  angiography of chest showed mild aneurysmal dilatation of the ascending thoracic aorta measuring up to 4.2 cm Annual imaging follow-up by CTA or MRA recommended  Essential hypertension (controlled) Continue Azor  GERD Barrett's esophagus Continue Protonix  DVT prophylaxis: Lovenox, SCDs  Code Status: Full code  Family Communication: None at bedside  Disposition Plan:  Patient is from:                        home Anticipated DC to:                   SNF or family members home Anticipated DC date:               2-3 days Anticipated DC barriers:          Patient requires inpatient management due to near syncopal episode which require further work-up    Consults called: None  Admission status: Observation    Bernadette Hoit MD [facility]  11/25/2020, 8:57 PM

## 2020-11-26 ENCOUNTER — Observation Stay (HOSPITAL_BASED_OUTPATIENT_CLINIC_OR_DEPARTMENT_OTHER): Payer: Medicare HMO

## 2020-11-26 ENCOUNTER — Observation Stay (HOSPITAL_COMMUNITY): Payer: Medicare HMO

## 2020-11-26 DIAGNOSIS — R55 Syncope and collapse: Secondary | ICD-10-CM | POA: Diagnosis not present

## 2020-11-26 LAB — MAGNESIUM: Magnesium: 2.1 mg/dL (ref 1.7–2.4)

## 2020-11-26 LAB — TROPONIN I (HIGH SENSITIVITY)
Troponin I (High Sensitivity): 101 ng/L (ref <18)
Troponin I (High Sensitivity): 76 ng/L — ABNORMAL HIGH (ref <18)
Troponin I (High Sensitivity): 75 ng/L — ABNORMAL HIGH (ref <18)

## 2020-11-26 LAB — FOLATE: Folate: 25.3 ng/mL (ref >5.9)

## 2020-11-26 LAB — COMPREHENSIVE METABOLIC PANEL
ALT: 21 U/L (ref 0–44)
AST: 25 U/L (ref 15–41)
Albumin: 3.7 g/dL (ref 3.5–5.0)
Alkaline Phosphatase: 100 U/L (ref 38–126)
Anion gap: 8 (ref 5–15)
BUN: 13 mg/dL (ref 8–23)
CO2: 25 mmol/L (ref 22–32)
Calcium: 10.7 mg/dL — ABNORMAL HIGH (ref 8.9–10.3)
Chloride: 106 mmol/L (ref 98–111)
Creatinine, Ser: 0.81 mg/dL (ref 0.61–1.24)
GFR, Estimated: >60 mL/min (ref >60)
Glucose, Bld: 93 mg/dL (ref 70–99)
Potassium: 4.2 mmol/L (ref 3.5–5.1)
Sodium: 139 mmol/L (ref 135–145)
Total Bilirubin: 0.6 mg/dL (ref 0.3–1.2)
Total Protein: 6.6 g/dL (ref 6.5–8.1)

## 2020-11-26 LAB — ECHOCARDIOGRAM COMPLETE
Area-P 1/2: 2.6 cm2
S' Lateral: 3.06 cm

## 2020-11-26 LAB — VITAMIN B12: Vitamin B-12: 540 pg/mL (ref 180–914)

## 2020-11-26 LAB — PROTIME-INR
INR: 1.1 (ref 0.8–1.2)
Prothrombin Time: 14.1 seconds (ref 11.4–15.2)

## 2020-11-26 LAB — CBC
HCT: 37.6 % — ABNORMAL LOW (ref 39.0–52.0)
Hemoglobin: 11.8 g/dL — ABNORMAL LOW (ref 13.0–17.0)
MCH: 32.8 pg (ref 26.0–34.0)
MCHC: 31.4 g/dL (ref 30.0–36.0)
MCV: 104.4 fL — ABNORMAL HIGH (ref 80.0–100.0)
Platelets: 166 10*3/uL (ref 150–400)
RBC: 3.6 MIL/uL — ABNORMAL LOW (ref 4.22–5.81)
RDW: 13.1 % (ref 11.5–15.5)
WBC: 5.2 10*3/uL (ref 4.0–10.5)
nRBC: 0 % (ref 0.0–0.2)

## 2020-11-26 LAB — APTT: aPTT: 31 seconds (ref 24–36)

## 2020-11-26 LAB — PHOSPHORUS: Phosphorus: 2.9 mg/dL (ref 2.5–4.6)

## 2020-11-26 MED ORDER — ASPIRIN EC 81 MG PO TBEC
81.0000 mg | DELAYED_RELEASE_TABLET | Freq: Every day | ORAL | Status: DC
  Administered 2020-11-26: 81 mg via ORAL
  Filled 2020-11-26: qty 1

## 2020-11-26 MED ORDER — OLMESARTAN MEDOXOMIL 40 MG PO TABS: 40.0000 mg | ORAL_TABLET | Freq: Every day | ORAL | 1 refills | Status: DC

## 2020-11-26 MED ORDER — PERFLUTREN LIPID MICROSPHERE
1.0000 mL | INTRAVENOUS | Status: DC | PRN
  Administered 2020-11-26: 2 mL via INTRAVENOUS

## 2020-11-26 NOTE — Discharge Summary (Signed)
DISCHARGE SUMMARY  Brett Bryant  MR#: [id number]  DOB:[birth year]  Date of Admission: 11/25/2020 Date of Discharge: 11/26/2020  Attending Physician:Kiesha Ensey Hennie Duos, MD  Patient's HG:[id number], Costella Hatcher, MD  Consults: none   Disposition: D/C home   Follow-up Appts:  Follow-up Information     Vyas, Dhruv B, MD Follow up in 1 week(s).   Specialty: Internal Medicine Contact information: [place] [address] [phone]                 Tests Needing Follow-up: -Assess blood pressure control with an eye towards liberalizing/avoiding strict control  Discharge Diagnoses: Presyncope/syncope HTN GERD  Initial presentation: 87yo w/ a hx of HTN and GERD who presented to the ED after having 2 presyncopal spells. This has been an ongoing issues since March of this year, and has persisted despite attempts to adjust his BP meds. He stated the symptoms occur when he gets up fast from the bed, and also when he is in a standing position for long time. In the ED his d-dimer was elevated, but a CTa chest was negative for PE. His w/u was otherwise unrevealing.   Hospital Course: During this admission the patient suffered no further presyncopal or syncopal spells.  His electrolytes were balanced and his renal function was normal.  He had a very mildly elevated troponin trend but not in a severe peaking fashion and never reaching a level greater than 101.  Folate and B12 levels were found to be within normal range.  He was mildly anemic at 11.8 but not severely so.  Bilateral carotid Dopplers were obtained and revealed no evidence of severe/critical carotid artery stenosis.  TTE revealed EF of 60-65% with no regional wall motion abnormalities and only grade 1/mild diastolic dysfunction.  There were no significant valvular abnormalities appreciated on TTE.  The patient was counseled at length on syncope.  His symptom constellation is suspicious for an orthostatic cause.  Beta-blocker was  recently discontinued and he was advised not to resume it.  Likewise hydralazine and his calcium channel blocker were discontinued in order to attempt to avoid all vasodilators.  Has sent home with a new prescription for his ARB only.  He is advised to monitor his blood pressure.  It has been suggested to him that perhaps we should liberalize our blood pressure goals in order to assure that we are not experiencing any episodes of hypotension.  He will follow his blood pressure at home and will follow-up with his primary care physician in 1 week for reassessment.  He has been advised that even though the source of his syncope is felt to be benign the syncopal spells themselves can lead to life-threatening circumstances depending upon the circumstances in which they occur.  He is advised that he should no longer drive.  He is advised to discontinue aspirin use for now until it is clear that he is not falling due to concern for potentiating intracranial hemorrhage.  He was comfortable with NAD, happy with the plan to discharge him home.  Allergies as of 11/26/2020   No Known Allergies      Medication List     STOP taking these medications    amLODipine-olmesartan 5-20 MG tablet Commonly known as: AZOR   amLODipine-olmesartan 5-40 MG tablet Commonly known as: AZOR   aspirin 81 MG EC tablet   hydrALAZINE 10 MG tablet Commonly known as: APRESOLINE   metoprolol succinate 25 MG 24 hr tablet Commonly known as: TOPROL-XL  valsartan-hydrochlorothiazide 160-12.5 MG tablet Commonly known as: DIOVAN-HCT       TAKE these medications    CO Q 10 PO Take 200 mg by mouth daily.   milk thistle 175 MG tablet Take 175 mg by mouth.   olmesartan 40 MG tablet Commonly known as: BENICAR Take 1 tablet (40 mg total) by mouth daily.   omeprazole 20 MG capsule Commonly known as: PRILOSEC Take 20 mg by mouth daily as needed (heartburn). What changed: Another medication with the same name was removed.  Continue taking this medication, and follow the directions you see here.   TURMERIC CURCUMIN PO Take 500 mg by mouth daily.   vitamin B-12 500 MCG tablet Commonly known as: CYANOCOBALAMIN Take 500 mcg by mouth daily.   Vitamin D3 125 MCG (5000 UT) Tabs Take 1,000 Units by mouth daily.   vitamin E 180 MG (400 UNITS) capsule Take 400 Units by mouth daily.        Day of Discharge BP 110/90   Pulse 76   Temp 98.2 F (36.8 C)   Resp 20   SpO2 99%   Physical Exam: General: No acute respiratory distress Lungs: Clear to auscultation bilaterally without wheezes or crackles Cardiovascular: Regular rate and rhythm without murmur gallop or rub normal S1 and S2 Abdomen: Nontender, nondistended, soft, bowel sounds positive, no rebound, no ascites, no appreciable mass Extremities: No significant cyanosis, clubbing, or edema bilateral lower extremities  Basic Metabolic Panel: Recent Labs  Lab 11/25/20 1617 11/26/20 0354  NA 134* 139  K 4.3 4.2  CL 101 106  CO2 26 25  GLUCOSE 117* 93  BUN 16 13  CREATININE 0.95 0.81  CALCIUM 11.0* 10.7*  MG  --  2.1  PHOS  --  2.9    Liver Function Tests: Recent Labs  Lab 11/26/20 0354  AST 25  ALT 21  ALKPHOS 100  BILITOT 0.6  PROT 6.6  ALBUMIN 3.7    Coags: Recent Labs  Lab 11/26/20 0354  INR 1.1    CBC: Recent Labs  Lab 11/25/20 1617 11/26/20 0354  WBC 6.0 5.2  NEUTROABS 4.1  --   HGB 13.8 11.8*  HCT 42.3 37.6*  MCV 102.9* 104.4*  PLT 188 166     CBG: Recent Labs  Lab 11/25/20 1706  GLUCAP 100*    Recent Results (from the past 240 hour(s))  Resp Panel by RT-PCR (Flu A&B, Covid) Nasopharyngeal Swab     Status: None   Collection Time: 11/25/20  4:32 PM   Specimen: Nasopharyngeal Swab; Nasopharyngeal(NP) swabs in vial transport medium  Result Value Ref Range Status   SARS Coronavirus 2 by RT PCR NEGATIVE NEGATIVE Final    Comment: (NOTE) SARS-CoV-2 target nucleic acids are NOT DETECTED.  The  SARS-CoV-2 RNA is generally detectable in upper respiratory specimens during the acute phase of infection. The lowest concentration of SARS-CoV-2 viral copies this assay can detect is 138 copies/mL. A negative result does not preclude SARS-Cov-2 infection and should not be used as the sole basis for treatment or other patient management decisions. A negative result may occur with  improper specimen collection/handling, submission of specimen other than nasopharyngeal swab, presence of viral mutation(s) within the areas targeted by this assay, and inadequate number of viral copies(<138 copies/mL). A negative result must be combined with clinical observations, patient history, and epidemiological information. The expected result is Negative.  Fact Sheet for Patients:  EntrepreneurPulse.com.au  Fact Sheet for Healthcare Providers:  IncredibleEmployment.be  This test  is no t yet approved or cleared by the [place] and  has been authorized for detection and/or diagnosis of SARS-CoV-2 by FDA under an Emergency Use Authorization (EUA). This EUA will remain  in effect (meaning this test can be used) for the duration of the COVID-19 declaration under Section 564(b)(1) of the Act, 21 U.S.C.section 360bbb-3(b)(1), unless the authorization is terminated  or revoked sooner.       Influenza A by PCR NEGATIVE NEGATIVE Final   Influenza B by PCR NEGATIVE NEGATIVE Final    Comment: (NOTE) The Xpert Xpress SARS-CoV-2/FLU/RSV plus assay is intended as an aid in the diagnosis of influenza from Nasopharyngeal swab specimens and should not be used as a sole basis for treatment. Nasal washings and aspirates are unacceptable for Xpert Xpress SARS-CoV-2/FLU/RSV testing.  Fact Sheet for Patients: EntrepreneurPulse.com.au  Fact Sheet for Healthcare Providers: IncredibleEmployment.be  This test is not yet approved or  cleared by the [place] FDA and has been authorized for detection and/or diagnosis of SARS-CoV-2 by FDA under an Emergency Use Authorization (EUA). This EUA will remain in effect (meaning this test can be used) for the duration of the COVID-19 declaration under Section 564(b)(1) of the Act, 21 U.S.C. section 360bbb-3(b)(1), unless the authorization is terminated or revoked.  Performed at [facility], [address], [place], [place] [address]       Time spent in discharge (includes decision making & examination of pt): 30 minutes  11/26/2020, 2:53 PM   Cherene Altes, MD [facility] Office  [phone]

## 2020-11-26 NOTE — Progress Notes (Signed)
  Echocardiogram 2D Echocardiogram has been performed.  Fidel Levy 11/26/2020, 9:57 AM

## 2020-11-28 ENCOUNTER — Telehealth: Payer: Self-pay

## 2020-11-28 ENCOUNTER — Encounter (HOSPITAL_COMMUNITY): Payer: Self-pay | Admitting: *Deleted

## 2020-11-28 ENCOUNTER — Other Ambulatory Visit: Payer: Self-pay

## 2020-11-28 ENCOUNTER — Emergency Department (HOSPITAL_COMMUNITY)
Admission: EM | Admit: 2020-11-28 | Discharge: 2020-11-29 | Disposition: A | Discharge status: Home / Self Care | Payer: Medicare HMO | Attending: Emergency Medicine | Admitting: Emergency Medicine

## 2020-11-28 DIAGNOSIS — R55 Syncope and collapse: Secondary | ICD-10-CM | POA: Insufficient documentation

## 2020-11-28 DIAGNOSIS — I10 Essential (primary) hypertension: Secondary | ICD-10-CM | POA: Insufficient documentation

## 2020-11-28 DIAGNOSIS — R42 Dizziness and giddiness: Secondary | ICD-10-CM | POA: Diagnosis not present

## 2020-11-28 DIAGNOSIS — R972 Elevated prostate specific antigen [PSA]: Secondary | ICD-10-CM

## 2020-11-28 MED ORDER — LEVOFLOXACIN 750 MG PO TABS: 750.0000 mg | ORAL_TABLET | Freq: Once | ORAL | 0 refills | Status: AC

## 2020-11-28 NOTE — Telephone Encounter (Signed)
Daughter called- instructions reviewed and appt set with daughter. Voiced understanding.

## 2020-11-28 NOTE — ED Triage Notes (Signed)
Pt with continued syncopal episodes x2 today.  Pt with recent admission for the same.

## 2020-11-28 NOTE — Telephone Encounter (Signed)
Spoke with patient- dates of 09/27 and 10/4 given to patient. Patient will discuss with daughter and return call to office on which date works better.

## 2020-11-28 NOTE — ED Notes (Signed)
Pt reports that he had the first syncopal episode in March which resulted in a fall which caused a subdural hematoma. Pt reports he has had similar syncopal episodes every couple of weeks but reports that he has had numerous episodes over the last 4 days and was evaluated at [facility] last week. Pt has not had any holter monitoring or neurology consults.

## 2020-11-28 NOTE — Telephone Encounter (Signed)
Called patient x2. No answer. Left message to return call to office.

## 2020-11-28 NOTE — Telephone Encounter (Signed)
Left a Voice Mail: Patient returned missed call for lab results.  Please call back at [phone].  Thanks, Helene Kelp

## 2020-11-28 NOTE — Progress Notes (Signed)
Instructions reviewed with daughter and copy mailed as well. Daughter voiced understanding.

## 2020-11-29 ENCOUNTER — Emergency Department (HOSPITAL_COMMUNITY): Payer: Medicare HMO

## 2020-11-29 ENCOUNTER — Other Ambulatory Visit: Payer: Self-pay

## 2020-11-29 ENCOUNTER — Emergency Department (HOSPITAL_COMMUNITY)
Admission: EM | Admit: 2020-11-29 | Discharge: 2020-11-29 | Disposition: A | Discharge status: Home / Self Care | Payer: Medicare HMO | Source: Home / Self Care

## 2020-11-29 DIAGNOSIS — Z5321 Procedure and treatment not carried out due to patient leaving prior to being seen by health care provider: Secondary | ICD-10-CM | POA: Insufficient documentation

## 2020-11-29 DIAGNOSIS — R55 Syncope and collapse: Secondary | ICD-10-CM | POA: Insufficient documentation

## 2020-11-29 DIAGNOSIS — R42 Dizziness and giddiness: Secondary | ICD-10-CM | POA: Insufficient documentation

## 2020-11-29 LAB — PROTIME-INR
INR: 1 (ref 0.8–1.2)
Prothrombin Time: 13 seconds (ref 11.4–15.2)

## 2020-11-29 LAB — BASIC METABOLIC PANEL
Anion gap: 9 (ref 5–15)
BUN: 15 mg/dL (ref 8–23)
CO2: 25 mmol/L (ref 22–32)
Calcium: 11.3 mg/dL — ABNORMAL HIGH (ref 8.9–10.3)
Chloride: 101 mmol/L (ref 98–111)
Creatinine, Ser: 1.06 mg/dL (ref 0.61–1.24)
GFR, Estimated: >60 mL/min (ref >60)
Glucose, Bld: 124 mg/dL — ABNORMAL HIGH (ref 70–99)
Potassium: 4.6 mmol/L (ref 3.5–5.1)
Sodium: 135 mmol/L (ref 135–145)

## 2020-11-29 LAB — COMPREHENSIVE METABOLIC PANEL
ALT: 26 U/L (ref 0–44)
AST: 28 U/L (ref 15–41)
Albumin: 3.9 g/dL (ref 3.5–5.0)
Alkaline Phosphatase: 106 U/L (ref 38–126)
Anion gap: 5 (ref 5–15)
BUN: 15 mg/dL (ref 8–23)
CO2: 26 mmol/L (ref 22–32)
Calcium: 10.2 mg/dL (ref 8.9–10.3)
Chloride: 104 mmol/L (ref 98–111)
Creatinine, Ser: 0.95 mg/dL (ref 0.61–1.24)
GFR, Estimated: >60 mL/min (ref >60)
Glucose, Bld: 122 mg/dL — ABNORMAL HIGH (ref 70–99)
Potassium: 4.3 mmol/L (ref 3.5–5.1)
Sodium: 135 mmol/L (ref 135–145)
Total Bilirubin: 0.5 mg/dL (ref 0.3–1.2)
Total Protein: 7.1 g/dL (ref 6.5–8.1)

## 2020-11-29 LAB — CBC
HCT: 38 % — ABNORMAL LOW (ref 39.0–52.0)
HCT: 38.4 % — ABNORMAL LOW (ref 39.0–52.0)
Hemoglobin: 12.5 g/dL — ABNORMAL LOW (ref 13.0–17.0)
Hemoglobin: 12.7 g/dL — ABNORMAL LOW (ref 13.0–17.0)
MCH: 33.8 pg (ref 26.0–34.0)
MCH: 33.4 pg (ref 26.0–34.0)
MCHC: 32.9 g/dL (ref 30.0–36.0)
MCHC: 33.1 g/dL (ref 30.0–36.0)
MCV: 102.7 fL — ABNORMAL HIGH (ref 80.0–100.0)
MCV: 101.1 fL — ABNORMAL HIGH (ref 80.0–100.0)
Platelets: 166 10*3/uL (ref 150–400)
Platelets: 177 10*3/uL (ref 150–400)
RBC: 3.7 MIL/uL — ABNORMAL LOW (ref 4.22–5.81)
RBC: 3.8 MIL/uL — ABNORMAL LOW (ref 4.22–5.81)
RDW: 13 % (ref 11.5–15.5)
RDW: 13.1 % (ref 11.5–15.5)
WBC: 5.5 10*3/uL (ref 4.0–10.5)
WBC: 5.4 10*3/uL (ref 4.0–10.5)
nRBC: 0 % (ref 0.0–0.2)
nRBC: 0 % (ref 0.0–0.2)

## 2020-11-29 LAB — TROPONIN I (HIGH SENSITIVITY)
Troponin I (High Sensitivity): 24 ng/L — ABNORMAL HIGH (ref <18)
Troponin I (High Sensitivity): 26 ng/L — ABNORMAL HIGH (ref <18)
Troponin I (High Sensitivity): 21 ng/L — ABNORMAL HIGH (ref <18)
Troponin I (High Sensitivity): 21 ng/L — ABNORMAL HIGH (ref <18)

## 2020-11-29 LAB — MAGNESIUM: Magnesium: 2.2 mg/dL (ref 1.7–2.4)

## 2020-11-29 LAB — BRAIN NATRIURETIC PEPTIDE: B Natriuretic Peptide: 87 pg/mL (ref 0.0–100.0)

## 2020-11-29 NOTE — Discharge Instructions (Addendum)
You were evaluated in the Emergency Department and after careful evaluation, we did not find any emergent condition requiring admission or further testing in the hospital.  Your exam/testing today is overall reassuring.  Recommend follow-up with cardiology to discuss the need for 30-day cardiac monitoring.  Please return to the Emergency Department if you experience any worsening of your condition.   Thank you for allowing [place] to be a part of your care.

## 2020-11-29 NOTE — ED Triage Notes (Signed)
T here POV with c/o of being light headed and near syncope. Seen at [facility] yesterday for same. Pt d/c and became symptomatic. Pt alert and oriented X4

## 2020-11-29 NOTE — ED Provider Notes (Signed)
[facility] Emergency Department Provider Note MRN:  [id number]  Arrival date & time: 11/29/20     Chief Complaint   Loss of Consciousness   History of Present Illness   Brett Bryant is a 87 y.o. year-old male with a history of hypertension, GERD presenting to the ED with chief complaint of loss of consciousness.  3 syncopal episodes this evening.  Occurring at rest.  Has been having recurrent syncopal episodes for the past 6 months.  Was recently discharged from the hospital after some syncopal episodes and chest pain.  Syncopal episodes seem to occur with walking or standing up for an extended period of time or working out in the yard.  Denies any chest pain or shortness of breath this evening, no abdominal pain, no numbness or weakness to the arms or legs, did not fall or hurt himself.  Review of Systems  A complete 10 system review of systems was obtained and all systems are negative except as noted in the HPI and PMH.   Patient's Health History    Past Medical History:  Diagnosis Date   Barrett's esophagus 12/15/2015   GERD (gastroesophageal reflux disease)    Hypertension     Past Surgical History:  Procedure Laterality Date   COLONOSCOPY     LIVER BIOPSY     Several Years ago per patient   UPPER GASTROINTESTINAL ENDOSCOPY      Family History  Problem Relation Age of Onset   Alzheimer's disease Sister    Healthy Sister    Heart disease Sister    Healthy Brother    Heart disease Brother    Kidney disease Brother    Heart disease Brother    Healthy Daughter     Social History   Socioeconomic History   Marital status: Widowed    Spouse name: Not on file   Number of children: Not on file   Years of education: Not on file   Highest education level: Not on file  Occupational History   Not on file  Tobacco Use   Smoking status: Never   Smokeless tobacco: Never  Vaping Use   Vaping Use: Never used  Substance and Sexual Activity    Alcohol use: No   Drug use: No   Sexual activity: Not on file  Other Topics Concern   Not on file  Social History Narrative   Not on file   Social Determinants of Health   Financial Resource Strain: Not on file  Food Insecurity: Not on file  Transportation Needs: Not on file  Physical Activity: Not on file  Stress: Not on file  Social Connections: Not on file  Intimate Partner Violence: Not on file     Physical Exam   Vitals:   11/29/20 0230 11/29/20 0300  BP: (!) 158/85 (!) 159/65  Pulse: 65 63  Resp: (!) 21 20  Temp:    SpO2: 100% 98%    CONSTITUTIONAL: Chronically ill-appearing, NAD NEURO:  Alert and oriented x 3, no focal deficits EYES:  eyes equal and reactive ENT/NECK:  no LAD, no JVD CARDIO: Regular rate, well-perfused, normal S1 and S2 PULM:  CTAB no wheezing or rhonchi GI/GU:  normal bowel sounds, non-distended, non-tender MSK/SPINE:  No gross deformities, no edema SKIN:  no rash, atraumatic PSYCH:  Appropriate speech and behavior  *Additional and/or pertinent findings included in MDM below  Diagnostic and Interventional Summary    EKG Interpretation  Date/Time:  Monday November 28 2020 22:53:45 EDT Ventricular  Rate:  67 PR Interval:  238 QRS Duration: 94 QT Interval:  380 QTC Calculation: 402 R Axis:   -56 Text Interpretation: Sinus rhythm Prolonged PR interval Left anterior fascicular block Low voltage, precordial leads Abnormal R-wave progression, late transition Borderline T abnormalities, anterior leads Confirmed by Gerlene Fee [phone]) on 11/29/2020 12:17:53 AM       Labs Reviewed  CBC - Abnormal; Notable for the following components:      Result Value   RBC 3.70 (*)    Hemoglobin 12.5 (*)    HCT 38.0 (*)    MCV 102.7 (*)    All other components within normal limits  COMPREHENSIVE METABOLIC PANEL - Abnormal; Notable for the following components:   Glucose, Bld 122 (*)    All other components within normal limits  TROPONIN I (HIGH  SENSITIVITY) - Abnormal; Notable for the following components:   Troponin I (High Sensitivity) 24 (*)    All other components within normal limits  TROPONIN I (HIGH SENSITIVITY) - Abnormal; Notable for the following components:   Troponin I (High Sensitivity) 26 (*)    All other components within normal limits  BRAIN NATRIURETIC PEPTIDE  MAGNESIUM  PROTIME-INR    DG Chest Port 1 View  Final Result    CT HEAD WO CONTRAST (5MM)  Final Result      Medications - No data to display   Procedures  /  Critical Care Procedures  ED Course and Medical Decision Making  I have reviewed the triage vital signs, the nursing notes, and pertinent available records from the EMR.  Listed above are laboratory and imaging tests that I personally ordered, reviewed, and interpreted and then considered in my medical decision making (see below for details).  Admission and thorough evaluation for recurrent syncopal episodes just 2 days ago.  CTA was normal, echo reassuring, no arrhythmia or episodes during the hospitalization.  Description seems more consistent with an orthostatic hypotension.  2 of his antihypertensives were stopped to see if this would help prevent any episodes of hypotension contributing to the episodes.  We will repeat some testing here in the emergency department but overall suspect continuation of the same chronic issue.  Needs cardiology follow-up possibly for cardiac monitoring.     Troponin is minimally elevated and flat, remainder work-up is reassuring, appropriate for discharge.  [name], MD [place] [email or web address]  Final Clinical Impressions(s) / ED Diagnoses     ICD-10-CM   1. Syncope, unspecified syncope type  R55       ED Discharge Orders          Ordered    Ambulatory referral to Cardiology        11/29/20 0308             Discharge Instructions Discussed with and Provided to Patient:      Discharge Instructions      You were evaluated in the Emergency Department and after careful evaluation, we did not find any emergent condition requiring admission or further testing in the hospital.  Your exam/testing today is overall reassuring.  Recommend follow-up with cardiology to discuss the need for 30-day cardiac monitoring.  Please return to the Emergency Department if you experience any worsening of your condition.   Thank you for allowing [place] to be a part of your care.        Maudie Flakes, MD 11/29/20 [phone]

## 2020-11-29 NOTE — ED Provider Notes (Signed)
Emergency Medicine Provider Triage Evaluation Note  Ecker Reilley , a 87 y.o. male  was evaluated in triage.  Pt complains of near syncope. Was recently seen at [facility] for the same last night.  Review of Systems  Positive: Syncope Negative: Chest pain  Physical Exam  BP (!) 179/83 (BP Location: Right Arm)   Pulse 66   Temp 98.3 F (36.8 C)   Resp 18   SpO2 99%  Gen:   Awake, no distress   Resp:  Normal effort  MSK:   Moves extremities without difficulty  Other:  Heart with rrr  Medical Decision Making  Medically screening exam initiated at 4:53 PM.  Appropriate orders placed.  Deke Rench was informed that the remainder of the evaluation will be completed by another provider, this initial triage assessment does not replace that evaluation, and the importance of remaining in the ED until their evaluation is complete.     Tivis Ringer, Whitley Strycharz S, PA-C [id number]    Lianne Cure, DO [phone]

## 2020-11-29 NOTE — ED Notes (Signed)
Patient left on own accord °

## 2020-12-02 ENCOUNTER — Other Ambulatory Visit: Payer: Self-pay

## 2020-12-02 ENCOUNTER — Encounter (HOSPITAL_COMMUNITY): Payer: Self-pay

## 2020-12-02 ENCOUNTER — Inpatient Hospital Stay (HOSPITAL_COMMUNITY)
Admission: EM | Admit: 2020-12-02 | Discharge: 2020-12-06 | DRG: 244 | Disposition: A | Discharge status: Home / Self Care | Payer: Medicare HMO | Attending: Internal Medicine | Admitting: Internal Medicine

## 2020-12-02 DIAGNOSIS — Z8249 Family history of ischemic heart disease and other diseases of the circulatory system: Secondary | ICD-10-CM

## 2020-12-02 DIAGNOSIS — R4020 Unspecified coma: Secondary | ICD-10-CM | POA: Diagnosis present

## 2020-12-02 DIAGNOSIS — I10 Essential (primary) hypertension: Secondary | ICD-10-CM | POA: Diagnosis present

## 2020-12-02 DIAGNOSIS — Z820 Family history of epilepsy and other diseases of the nervous system: Secondary | ICD-10-CM

## 2020-12-02 DIAGNOSIS — I455 Other specified heart block: Secondary | ICD-10-CM | POA: Diagnosis present

## 2020-12-02 DIAGNOSIS — I4430 Unspecified atrioventricular block: Secondary | ICD-10-CM | POA: Diagnosis not present

## 2020-12-02 DIAGNOSIS — K2270 Barrett's esophagus without dysplasia: Secondary | ICD-10-CM | POA: Diagnosis present

## 2020-12-02 DIAGNOSIS — Z950 Presence of cardiac pacemaker: Secondary | ICD-10-CM

## 2020-12-02 DIAGNOSIS — I442 Atrioventricular block, complete: Secondary | ICD-10-CM | POA: Diagnosis not present

## 2020-12-02 DIAGNOSIS — K7460 Unspecified cirrhosis of liver: Secondary | ICD-10-CM | POA: Diagnosis present

## 2020-12-02 DIAGNOSIS — R55 Syncope and collapse: Secondary | ICD-10-CM | POA: Diagnosis present

## 2020-12-02 DIAGNOSIS — K219 Gastro-esophageal reflux disease without esophagitis: Secondary | ICD-10-CM | POA: Diagnosis present

## 2020-12-02 DIAGNOSIS — Z79899 Other long term (current) drug therapy: Secondary | ICD-10-CM

## 2020-12-02 DIAGNOSIS — Z841 Family history of disorders of kidney and ureter: Secondary | ICD-10-CM

## 2020-12-02 DIAGNOSIS — Z8673 Personal history of transient ischemic attack (TIA), and cerebral infarction without residual deficits: Secondary | ICD-10-CM

## 2020-12-02 DIAGNOSIS — Z20822 Contact with and (suspected) exposure to covid-19: Secondary | ICD-10-CM | POA: Diagnosis present

## 2020-12-02 DIAGNOSIS — R001 Bradycardia, unspecified: Secondary | ICD-10-CM | POA: Diagnosis present

## 2020-12-02 DIAGNOSIS — R569 Unspecified convulsions: Secondary | ICD-10-CM

## 2020-12-02 NOTE — ED Triage Notes (Signed)
Pt arrived via POV c/o of multiple seizures at home earlier this evening. Family member reports Pt had apprx 13 seizures with in 1 hour time period. Pt denies LOC, pain and is A+O X 4 in Triage. Pt denies voiding after subjective seizures.

## 2020-12-03 DIAGNOSIS — R4020 Unspecified coma: Secondary | ICD-10-CM | POA: Diagnosis not present

## 2020-12-03 DIAGNOSIS — I499 Cardiac arrhythmia, unspecified: Secondary | ICD-10-CM

## 2020-12-03 DIAGNOSIS — R55 Syncope and collapse: Secondary | ICD-10-CM

## 2020-12-03 LAB — URINALYSIS, COMPLETE (UACMP) WITH MICROSCOPIC
Bacteria, UA: NONE SEEN
Bilirubin Urine: NEGATIVE
Glucose, UA: NEGATIVE mg/dL
Hgb urine dipstick: NEGATIVE
Ketones, ur: NEGATIVE mg/dL
Leukocytes,Ua: NEGATIVE
Nitrite: NEGATIVE
Protein, ur: NEGATIVE mg/dL
Specific Gravity, Urine: 1.01 (ref 1.005–1.030)
pH: 7 (ref 5.0–8.0)

## 2020-12-03 LAB — CBC WITH DIFFERENTIAL/PLATELET
Abs Immature Granulocytes: 0.01 10*3/uL (ref 0.00–0.07)
Basophils Absolute: 0.1 10*3/uL (ref 0.0–0.1)
Basophils Relative: 1 %
Eosinophils Absolute: 0.1 10*3/uL (ref 0.0–0.5)
Eosinophils Relative: 1 %
HCT: 36.6 % — ABNORMAL LOW (ref 39.0–52.0)
Hemoglobin: 12 g/dL — ABNORMAL LOW (ref 13.0–17.0)
Immature Granulocytes: 0 %
Lymphocytes Relative: 22 %
Lymphs Abs: 1.1 10*3/uL (ref 0.7–4.0)
MCH: 34 pg (ref 26.0–34.0)
MCHC: 32.8 g/dL (ref 30.0–36.0)
MCV: 103.7 fL — ABNORMAL HIGH (ref 80.0–100.0)
Monocytes Absolute: 0.5 10*3/uL (ref 0.1–1.0)
Monocytes Relative: 10 %
Neutro Abs: 3.2 10*3/uL (ref 1.7–7.7)
Neutrophils Relative %: 66 %
Platelets: 158 10*3/uL (ref 150–400)
RBC: 3.53 MIL/uL — ABNORMAL LOW (ref 4.22–5.81)
RDW: 13.1 % (ref 11.5–15.5)
WBC: 4.9 10*3/uL (ref 4.0–10.5)
nRBC: 0 % (ref 0.0–0.2)

## 2020-12-03 LAB — COMPREHENSIVE METABOLIC PANEL
ALT: 21 U/L (ref 0–44)
AST: 23 U/L (ref 15–41)
Albumin: 3.9 g/dL (ref 3.5–5.0)
Alkaline Phosphatase: 93 U/L (ref 38–126)
Anion gap: 8 (ref 5–15)
BUN: 16 mg/dL (ref 8–23)
CO2: 24 mmol/L (ref 22–32)
Calcium: 10.2 mg/dL (ref 8.9–10.3)
Chloride: 105 mmol/L (ref 98–111)
Creatinine, Ser: 1 mg/dL (ref 0.61–1.24)
GFR, Estimated: >60 mL/min (ref >60)
Glucose, Bld: 116 mg/dL — ABNORMAL HIGH (ref 70–99)
Potassium: 4.6 mmol/L (ref 3.5–5.1)
Sodium: 137 mmol/L (ref 135–145)
Total Bilirubin: 0.6 mg/dL (ref 0.3–1.2)
Total Protein: 6.7 g/dL (ref 6.5–8.1)

## 2020-12-03 LAB — ETHANOL: Alcohol, Ethyl (B): <10 mg/dL (ref <10)

## 2020-12-03 MED ORDER — OXYCODONE HCL 5 MG PO TABS: 5.0000 mg | ORAL_TABLET | ORAL | Status: DC | PRN

## 2020-12-03 MED ORDER — ONDANSETRON HCL 4 MG/2ML IJ SOLN: 4.0000 mg | Freq: Four times a day (QID) | INTRAMUSCULAR | Status: DC | PRN

## 2020-12-03 MED ORDER — ACETAMINOPHEN 650 MG RE SUPP: 650.0000 mg | Freq: Four times a day (QID) | RECTAL | Status: DC | PRN

## 2020-12-03 MED ORDER — HEPARIN SODIUM (PORCINE) 5000 UNIT/ML IJ SOLN
5000.0000 [IU] | Freq: Three times a day (TID) | INTRAMUSCULAR | Status: DC
  Administered 2020-12-04: 5000 [IU] via SUBCUTANEOUS
  Filled 2020-12-03 (×2): qty 1

## 2020-12-03 MED ORDER — ONDANSETRON HCL 4 MG PO TABS: 4.0000 mg | ORAL_TABLET | Freq: Four times a day (QID) | ORAL | Status: DC | PRN

## 2020-12-03 MED ORDER — PANTOPRAZOLE SODIUM 40 MG PO TBEC
40.0000 mg | DELAYED_RELEASE_TABLET | Freq: Every day | ORAL | Status: DC
  Administered 2020-12-03 – 2020-12-06 (×3): 40 mg via ORAL
  Filled 2020-12-03 (×3): qty 1

## 2020-12-03 MED ORDER — ACETAMINOPHEN 325 MG PO TABS: 650.0000 mg | ORAL_TABLET | Freq: Four times a day (QID) | ORAL | Status: DC | PRN

## 2020-12-03 MED ORDER — IRBESARTAN 150 MG PO TABS
150.0000 mg | ORAL_TABLET | Freq: Every day | ORAL | Status: DC
  Administered 2020-12-03: 150 mg via ORAL
  Filled 2020-12-03: qty 1

## 2020-12-03 NOTE — H&P (Signed)
TRH H&P    Patient Demographics:    Brett Bryant, is a 87 y.o. male  MRN: [id number]  DOB - [birth year]  Admit Date - 12/02/2020  Referring MD/NP/PA: Stark Jock   Outpatient Primary MD for the patient is Glenda Chroman, MD  Patient coming from: Home  Chief complaint- Seizure like activity   HPI:    [name]  is a 87 y.o. male, with a history of Barrett's esophagus, GERD, hypertension, stroke, presents the ED with a chief complaint of seizure-like activity.  He was recently admitted for the same thing, and discharged on September 10.  At that presentation he had 2 presyncopal spells.  He reported that these problems have been going on since March, and in the outpatient world there were attempts to adjust his blood pressure meds.  He describes the symptoms as orthostatic at the presentation of the last admission.  CTA chest was done and was negative for PE.  Folate and B12 are found to be within normal range.  He was mildly anemic, but not enough to cause symptoms.  He had bilateral carotid Dopplers that showed right side 50-69% blockage, left side no hemodynamically significant stenoses.  He had an echo that showed EF 60 to 65% with grade 1 mild diastolic dysfunction.  Medication changes that he is recently had where the beta-blocker being stopped, hydralazine, calcium channel blocker being discontinued as well.  He has been advised to avoid all vasodilators.  Ultimately no source of his symptoms was identified.  Today: Daughter is with patient reports that there have been 12 episodes of back stiffening, head going backwards, rhythmic jerking of shoulders, lasting about 5 seconds, with 90 seconds between episodes.  When he comes to he knows exactly where he is and what he is doing.  He does report prodromal symptoms of buzzing in his ears and feeling like he needs to sit down.  Patient reports that he collapses, but he is  always in a chair since he has the prodromal symptoms of the hand.  He has not hit his head.  He does report loss of consciousness and most of these episodes, but sometimes he reports its more like near syncope.  He has no change in his vision.  Today standing made the episodes go away.  When he started he was just relaxing in his recliner watching TV.  He denies any nausea, loss of bladder or bowel control, biting of tongue.  Somebody who witnessed the episode says that he does start gasping and deep breathing before the loss of consciousness happens.  Patient reports he has been eating and drinking normally, he is otherwise been in his normal state of health.  He reports that these the same episodes of been going on since March.  Patient has no other complaints at this time.  Patient does not smoke, does not drink alcohol, does not use illicit drugs.  He is vaccinated for COVID.  Patient is full code.  In the ED Temp 98, heart rate 50-62, respiratory rate 14-23 blood pressure 135/61,  satting at 97% Leukocytosis with white blood cell count of 4.9, hemoglobin 12.0, platelets 158 Chemistry panel is unremarkable Inpatient consult to neuro was placed by ED provider Telemetry neuro was consulted and advised that this does not sound like seizure activity to them.  The lesions were likely syncope event.  The events described lactic post ictal state and from a neuro standpoint they recommended cardiac monitoring     Review of systems:    In addition to the HPI above,  No Fever-chills, No Headache, No changes with Vision, No problems swallowing food or Liquids, No Chest pain, Cough or Shortness of Breath, No Abdominal pain, No Nausea or Vomiting, bowel movements are regular, No Blood in stool or Urine, No dysuria, No new skin rashes or bruises, No new joints pains-aches,  No new weakness, tingling, numbness in any extremity, No recent weight gain or loss, No polyuria, polydypsia or polyphagia, No  significant Mental Stressors.  All other systems reviewed and are negative.    Past History of the following :    Past Medical History:  Diagnosis Date   Barrett's esophagus 12/15/2015   GERD (gastroesophageal reflux disease)    Hypertension       Past Surgical History:  Procedure Laterality Date   COLONOSCOPY     LIVER BIOPSY     Several Years ago per patient   UPPER GASTROINTESTINAL ENDOSCOPY        Social History:      Social History   Tobacco Use   Smoking status: Never   Smokeless tobacco: Never  Substance Use Topics   Alcohol use: No       Family History :     Family History  Problem Relation Age of Onset   Alzheimer's disease Sister    Healthy Sister    Heart disease Sister    Healthy Brother    Heart disease Brother    Kidney disease Brother    Heart disease Brother    Healthy Daughter       Home Medications:   Prior to Admission medications   Medication Sig Start Date End Date Taking? Authorizing Provider  Cholecalciferol (VITAMIN D3) 125 MCG (5000 UT) TABS Take 1,000 Units by mouth daily.    [provider]  Coenzyme Q10 (CO Q 10 PO) Take 200 mg by mouth daily.    [provider]  levofloxacin (LEVAQUIN) 750 MG tablet Take 750 mg by mouth daily. 11/28/20   [provider]  milk thistle 175 MG tablet Take 175 mg by mouth.    [provider]  olmesartan (BENICAR) 40 MG tablet Take 1 tablet (40 mg total) by mouth daily. 11/26/20   Cherene Altes, MD  omeprazole (PRILOSEC) 20 MG capsule Take 20 mg by mouth daily as needed (heartburn).    [provider]  TURMERIC CURCUMIN PO Take 500 mg by mouth daily.    [provider]  vitamin B-12 (CYANOCOBALAMIN) 500 MCG tablet Take 500 mcg by mouth daily.    [provider]  vitamin E 400 UNIT capsule Take 400 Units by mouth daily.    [provider]     Allergies:    No Known Allergies   Physical Exam:   Vitals  Blood  pressure 135/61, pulse 61, temperature 98 F (36.7 C), temperature source Oral, resp. rate (!) 21, height '5\' 8"'$  (1.727 m), weight 83.9 kg, SpO2 97 %.  1.  General: Patient lying supine in bed,  no acute distress  2. Psychiatric: Alert and oriented x 3, mood and behavior normal for situation, pleasant and cooperative with exam   3. Neurologic: Speech and language are normal, face is symmetric, moves all 4 extremities voluntarily, at baseline without acute deficits on limited exam   4. HEENMT:  Head is atraumatic, normocephalic, pupils reactive to light, neck is supple, trachea is midline, mucous membranes are moist   5. Respiratory : Lungs are clear to auscultation bilaterally without wheezing, rhonchi, rales, no cyanosis, no increase in work of breathing or accessory muscle use   6. Cardiovascular : Heart rate bradycardic, rhythm is regular, no murmurs, rubs or gallops, trace peripheral edema, peripheral pulses palpated   7. Gastrointestinal:  Abdomen is soft, nondistended, nontender to palpation bowel sounds active, no masses or organomegaly palpated   8. Skin:  Skin is warm, dry and intact without rashes, acute lesions, or ulcers on limited exam   9.Musculoskeletal:  No acute deformities or trauma, no asymmetry in tone, trace peripheral edema, peripheral pulses palpated, no tenderness to palpation in the extremities     Data Review:    CBC Recent Labs  Lab 11/28/20 2300 11/29/20 1655 12/03/20 0150  WBC 5.5 5.4 4.9  HGB 12.5* 12.7* 12.0*  HCT 38.0* 38.4* 36.6*  PLT 166 177 158  MCV 102.7* 101.1* 103.7*  MCH 33.8 33.4 34.0  MCHC 32.9 33.1 32.8  RDW 13.0 13.1 13.1  LYMPHSABS  --   --  1.1  MONOABS  --   --  0.5  EOSABS  --   --  0.1  BASOSABS  --   --  0.1   ------------------------------------------------------------------------------------------------------------------  Results for orders placed or performed during the hospital encounter of 12/02/20 (from the  past 48 hour(s))  Comprehensive metabolic panel     Status: Abnormal   Collection Time: 12/03/20  1:50 AM  Result Value Ref Range   Sodium 137 135 - 145 mmol/L   Potassium 4.6 3.5 - 5.1 mmol/L   Chloride 105 98 - 111 mmol/L   CO2 24 22 - 32 mmol/L   Glucose, Bld 116 (H) 70 - 99 mg/dL    Comment: Glucose reference range applies only to samples taken after fasting for at least 8 hours.   BUN 16 8 - 23 mg/dL   Creatinine, Ser 1.00 0.61 - 1.24 mg/dL   Calcium 10.2 8.9 - 10.3 mg/dL   Total Protein 6.7 6.5 - 8.1 g/dL   Albumin 3.9 3.5 - 5.0 g/dL   AST 23 15 - 41 U/L   ALT 21 0 - 44 U/L   Alkaline Phosphatase 93 38 - 126 U/L   Total Bilirubin 0.6 0.3 - 1.2 mg/dL   GFR, Estimated >60 >60 mL/min    Comment: (NOTE) Calculated using the CKD-EPI Creatinine Equation (2021)    Anion gap 8 5 - 15    Comment: Performed at [facility], [address] [place], [place] [address]  CBC with Differential     Status: Abnormal   Collection Time: 12/03/20  1:50 AM  Result Value Ref Range   WBC 4.9 4.0 - 10.5 K/uL   RBC 3.53 (L) 4.22 - 5.81 MIL/uL   Hemoglobin 12.0 (L) 13.0 - 17.0 g/dL   HCT 36.6 (L) 39.0 - 52.0 %   MCV 103.7 (H) 80.0 - 100.0 fL   MCH 34.0 26.0 - 34.0 pg   MCHC 32.8 30.0 - 36.0 g/dL   RDW 13.1 11.5 - 15.5 %   Platelets 158 150 - 400  K/uL   nRBC 0.0 0.0 - 0.2 %   Neutrophils Relative % 66 %   Neutro Abs 3.2 1.7 - 7.7 K/uL   Lymphocytes Relative 22 %   Lymphs Abs 1.1 0.7 - 4.0 K/uL   Monocytes Relative 10 %   Monocytes Absolute 0.5 0.1 - 1.0 K/uL   Eosinophils Relative 1 %   Eosinophils Absolute 0.1 0.0 - 0.5 K/uL   Basophils Relative 1 %   Basophils Absolute 0.1 0.0 - 0.1 K/uL   Immature Granulocytes 0 %   Abs Immature Granulocytes 0.01 0.00 - 0.07 K/uL    Comment: Performed at [facility], [address] [place], [place] [address]  Ethanol     Status: None   Collection Time: 12/03/20  1:50 AM  Result Value Ref Range   Alcohol, Ethyl (B) <10 <10 mg/dL    Comment:  (NOTE) Lowest detectable limit for serum alcohol is 10 mg/dL.  For medical purposes only. Performed at [facility], [address] [place], [place] [address]     Chemistries  Recent Labs  Lab 11/28/20 2300 11/29/20 1655 12/03/20 0150  NA 135 135 137  K 4.3 4.6 4.6  CL 104 101 105  CO2 '26 25 24  '$ GLUCOSE 122* 124* 116*  BUN '15 15 16  '$ CREATININE 0.95 1.06 1.00  CALCIUM 10.2 11.3* 10.2  MG 2.2  --   --   AST 28  --  23  ALT 26  --  21  ALKPHOS 106  --  93  BILITOT 0.5  --  0.6   ------------------------------------------------------------------------------------------------------------------  ------------------------------------------------------------------------------------------------------------------ GFR: Estimated Creatinine Clearance: 57 mL/min (by C-G formula based on SCr of 1 mg/dL). Liver Function Tests: Recent Labs  Lab 11/28/20 2300 12/03/20 0150  AST 28 23  ALT 26 21  ALKPHOS 106 93  BILITOT 0.5 0.6  PROT 7.1 6.7  ALBUMIN 3.9 3.9   No results for input(s): LIPASE, AMYLASE in the last 168 hours. No results for input(s): AMMONIA in the last 168 hours. Coagulation Profile: Recent Labs  Lab 11/28/20 2300  INR 1.0   Cardiac Enzymes: No results for input(s): CKTOTAL, CKMB, CKMBINDEX, TROPONINI in the last 168 hours. BNP (last 3 results) No results for input(s): PROBNP in the last 8760 hours. HbA1C: No results for input(s): HGBA1C in the last 72 hours. CBG: No results for input(s): GLUCAP in the last 168 hours. Lipid Profile: No results for input(s): CHOL, HDL, LDLCALC, TRIG, CHOLHDL, LDLDIRECT in the last 72 hours. Thyroid Function Tests: No results for input(s): TSH, T4TOTAL, FREET4, T3FREE, THYROIDAB in the last 72 hours. Anemia Panel: No results for input(s): VITAMINB12, FOLATE, FERRITIN, TIBC, IRON, RETICCTPCT in the last 72  hours.  --------------------------------------------------------------------------------------------------------------- Urine analysis:    Component Value Date/Time   COLORURINE YELLOW 11/25/2020 1642   APPEARANCEUR CLEAR 11/25/2020 1642   APPEARANCEUR Clear 10/18/2020 1358   LABSPEC 1.015 11/25/2020 1642   PHURINE 8.0 11/25/2020 1642   GLUCOSEU NEGATIVE 11/25/2020 1642   HGBUR NEGATIVE 11/25/2020 [place] 11/25/2020 1642   BILIRUBINUR Negative 10/18/2020 [place] 11/25/2020 1642   PROTEINUR NEGATIVE 11/25/2020 1642   UROBILINOGEN 0.2 08/25/2019 1031   NITRITE NEGATIVE 11/25/2020 1642   LEUKOCYTESUR NEGATIVE 11/25/2020 1642      Imaging Results:    No results found.  Last EKG is from 9/14 and shows rate of 63, first-degree AV block.  We will repeat EKG today.  Last CT of his head is from September 13  and shows no acute intracranial abnormality.  CTA of his chest was done on September 9 and shows no PE   Assessment & Plan:    Active Problems:   Loss of consciousness (HCC)   Loss of consciousness Family was concerned about seizure, seems more consistent with syncope Patient has been undergoing syncope work-up since March Some of the recent images are listed above including CT head and CTA chest Patient also had an echo on September 10 that showed grade 1 diastolic dysfunction with an EF of 60 to 65% but no cause of syncope Patient has not yet done a Holter monitor at home but it seems like that the next step, consult cardiology Monitor on telemetry Daughter thinks it is worse when he is up moving around, consult PT for mobility ED provider has placed a consult for neuro based on the seizure-like activity reported by family Continue to monitor Hypertension Patient has recently been taken off of his beta-blocker and hydralazine.  He remains on Benicar Will hold Benicar during this hospitalization Continue to monitor GERD Continue  PPI    DVT Prophylaxis-Heparin- SCDs   AM Labs Ordered, also please review Full Orders  Family Communication: Admission, patients condition and plan of care including tests being ordered have been discussed with the patient and daughter who indicate understanding and agree with the plan and Code Status.  Code Status: Full  Admission status: ObservationTime spent in minutes : [place]

## 2020-12-03 NOTE — Progress Notes (Signed)
Patient seen and examined.  In no acute distress at time of my first evaluation.  Already perform work-up review and essentially unremarkable.  We were planning to arrange outpatient cardiac event monitoring/Holter and looking for patient to be discharged; while waiting for this coordination to happen patient expressed feeling same sensations that he had during his presyncope/syncope events at home and telemetry was able to capture a 16.8 seconds ventricular pause.  Following that patient's heart rate has been sinus bradycardia.  Patient denies chest pain, focal neurologic deficits, shortness of breath, dysuria, hematuria, melena, hematochezia, abdominal pain or any other complaints. Case discussed with Dr. Domenic Polite (cardiology service) who recommended patient to be transfer to [facility] for electrophysiology evaluation as his findings might end up requiring treatment with a pacemaker implantation. Please refer to H&P written by Dr. Clearence Ped for further info/details of admission.  Plan: -Transfer to progressive bed at [facility] for electrophysiology evaluation and definitive treatment/management. -If needed patient will Require pacing and wire at receiving unit. -Follow electrolytes and thyroid panel as ordered. -Cardiology service I aware of transfer and will follow patient after he arrived to [facility].  Barton Dubois MD [phone]

## 2020-12-03 NOTE — Evaluation (Addendum)
Physical Therapy Evaluation Patient Details Name: Brett Bryant MRN: [id number] DOB: 12-21-1933 Today's Date: 12/03/2020  History of Present Illness  Brett Bryant  is a 87 y.o. male, with a history of Barrett's esophagus, GERD, hypertension, stroke, presents the ED with a chief complaint of seizure-like activity.  He was recently admitted for the same thing, and discharged on September 10.  At that presentation he had 2 presyncopal spells.  He reported that these problems have been going on since March, and in the outpatient world there were attempts to adjust his blood pressure meds.  He describes the symptoms as orthostatic at the presentation of the last admission.  CTA chest was done and was negative for PE.  Folate and B12 are found to be within normal range.  He was mildly anemic, but not enough to cause symptoms.  He had bilateral carotid Dopplers that showed right side 50-69% blockage, left side no hemodynamically significant stenoses.  He had an echo that showed EF 60 to 65% with grade 1 mild diastolic dysfunction.  Medication changes that he is recently had where the beta-blocker being stopped, hydralazine, calcium channel blocker being discontinued as well.  He has been advised to avoid all vasodilators.  Ultimately no source of his symptoms was identified.   Clinical Impression  Patient functioning at baseline for functional mobility and gait demonstrating good return for ambulation in room, hallways and on stairs.  Plan:  Patient discharged from physical therapy to care of nursing for ambulation daily as tolerated for length of stay. Patient's orthostatic BP's as follows, lying 172/59, sitting 180/116, and standing 163/95 - MD/RN notified.        Recommendations for follow up therapy are one component of a multi-disciplinary discharge planning process, led by the attending physician.  Recommendations may be updated based on patient status, additional functional criteria and insurance  authorization.  Follow Up Recommendations No PT follow up    Equipment Recommendations  None recommended by PT    Recommendations for Other Services       Precautions / Restrictions Precautions Precautions: None Restrictions Weight Bearing Restrictions: No      Mobility  Bed Mobility Overal bed mobility: Independent                  Transfers Overall transfer level: Independent                  Ambulation/Gait Ambulation/Gait assistance: Modified independent (Device/Increase time) Gait Distance (Feet): 200 Feet Assistive device: None Gait Pattern/deviations: WFL(Within Functional Limits) Gait velocity: slightly decreased   General Gait Details: grossly WFL demonsrating good return for ambulation in room, hallways without loss of balance  Stairs Stairs: Yes Stairs assistance: Modified independent (Device/Increase time) Stair Management: One rail Left;One rail Right;Alternating pattern Number of Stairs: 10 General stair comments: demonstrates good return going up/down 10 steps using 1 siderail without loss of balance  Wheelchair Mobility    Modified Rankin (Stroke Patients Only)       Balance Overall balance assessment: No apparent balance deficits (not formally assessed)                                           Pertinent Vitals/Pain Pain Assessment: No/denies pain    Home Living Family/patient expects to be discharged to:: Private residence Living Arrangements: Alone Available Help at Discharge: Family;Neighbor;Available PRN/intermittently Type of Home: House Home Access:  Stairs to enter Entrance Stairs-Rails: Right Entrance Stairs-Number of Steps: 4 Home Layout: Two level Home Equipment: [place] - 2 wheels;Cane - single point;Grab bars - tub/shower      Prior Function Level of Independence: Independent         Comments: Hydrographic surveyor, drives     Journalist, newspaper        Extremity/Trunk Assessment    Upper Extremity Assessment Upper Extremity Assessment: Overall WFL for tasks assessed    Lower Extremity Assessment Lower Extremity Assessment: Overall WFL for tasks assessed    Cervical / Trunk Assessment Cervical / Trunk Assessment: Normal  Communication   Communication: No difficulties  Cognition Arousal/Alertness: Awake/alert Behavior During Therapy: WFL for tasks assessed/performed Overall Cognitive Status: Within Functional Limits for tasks assessed                                        General Comments      Exercises     Assessment/Plan    PT Assessment Patent does not need any further PT services  PT Problem List         PT Treatment Interventions      PT Goals (Current goals can be found in the Care Plan section)  Acute Rehab PT Goals Patient Stated Goal: return home PT Goal Formulation: With patient Time For Goal Achievement: 12/03/20 Potential to Achieve Goals: Good    Frequency     Barriers to discharge        Co-evaluation               AM-PAC PT "6 Clicks" Mobility  Outcome Measure Help needed turning from your back to your side while in a flat bed without using bedrails?: None Help needed moving from lying on your back to sitting on the side of a flat bed without using bedrails?: None Help needed moving to and from a bed to a chair (including a wheelchair)?: None Help needed standing up from a chair using your arms (e.g., wheelchair or bedside chair)?: None Help needed to walk in hospital room?: None Help needed climbing 3-5 steps with a railing? : None 6 Click Score: 24    End of Session   Activity Tolerance: Patient tolerated treatment well Patient left: in chair;with call bell/phone within reach Nurse Communication: Mobility status PT Visit Diagnosis: Unsteadiness on feet (R26.81);Other abnormalities of gait and mobility (R26.89);Muscle weakness (generalized) (M62.81)    Time: [id number] PT Time Calculation  (min) (ACUTE ONLY): 20 min   Charges:   PT Evaluation $PT Eval Moderate Complexity: 1 Mod PT Treatments $Therapeutic Activity: 8-22 mins        10:31 AM, 12/03/20 Lonell Grandchild, MPT Physical Therapist with [facility] [phone] office [phone] mobile phone

## 2020-12-03 NOTE — Progress Notes (Signed)
Patient is resting in the chair, family in room with him. We are waiting on bed ready at [facility]. No s/s distress at this time.

## 2020-12-03 NOTE — Progress Notes (Signed)
Bed ready at [facility] for pt. [facility] called and was given report. This RN then called RN at [facility] to give report. Angie was concerned about pt's elevated BP. This RN reached out to Dr. Clearence Ped, who stated that unless pt was symptomatic, no medication was to be given, as she did not want pt to get anything that could change his HR. Angie RN at [facility] was called back and informed. [facility] also aware of elevated BP and MD's orders. Pt left with [facility] at 2100.  Pt was A/O with no sxs or c/o of distress.

## 2020-12-03 NOTE — Progress Notes (Signed)
ED nurse called back, however, she was busy in a room. Message left for RN to call back.

## 2020-12-03 NOTE — Consult Note (Signed)
[facility] TeleNeurology Consult Services  Stat Consult  Date of Service:   12/03/2020 03:43:53  Diagnosis:       R55 - Syncope (blackout, fainting, vasovagal attack)  Impression 87 y/o man with recurrent syncopal episodes. Per the detailed description given by his daughter, I do not think these events are suggestive of seizures, due to the lack of post-ictal state among other features. I don't have any work-up to recommend from a neurologic standpoint, but would consider extended cardiac monitoring to evaluate for arrhythmia.   Disposition: Neurology will sign off. Reconsult if Needed.   Metrics: [facility] Notification Time: 12/03/2020 03:42:19 Stamp Time: 12/03/2020 03:43:53 Callback Response Time: 12/03/2020 03:54:15   ----------------------------------------------------------------------------------------------------  Chief Complaint: spells  History of Present Illness: Patient is a 87 year old Male.  Patient presents tonight for multiple episodes describes by his daughter as starting with heavy breathing with eyes rolling back followed by losing consciousness and falling down (if standing) or slumping over (if sitting) with some shaking of his upper body, he will be unconscious for less than 5 seconds with immediate return to awareness. Patient states he can feel when it's coming on, says he hears a buzzing in his ears and feels like he needs to sit down, and then it happens and he feels back to normal right afterwards. This has been going on since March of this year. He was admitted for the same type of episodes earlier this month, had TTE and carotid u/s which were unremarkable.    Past Medical History: Hypertension Stroke cirrhosis, SDH  Anticoagulant use:  No  Antiplatelet use: No    Examination: BP(167/79), Pulse(50), Blood Glucose(116)  Neuro Exam: General: Alert,Awake, Oriented to Time, Place, Person  Speech:  Fluent:  Language: Intact:  Face: Symmetric:  Facial Sensation: Intact:  Visual Fields: Intact:  Extraocular Movements: Intact:  Motor Exam: No Drift:  Sensation: Intact:  Coordination: Intact:     Patient / Family was informed the Neurology Consult would occur via TeleHealth consult by way of interactive audio and video telecommunications and consented to receiving care in this manner.  Patient is being evaluated for possible acute neurologic impairment and high probability of imminent or life - threatening deterioration.I spent total of 28 minutes providing care to this patient, including time for face to face visit via telemedicine, review of medical records, imaging studies and discussion of findings with providers, the patient and / or family.   Dr Eleonore Chiquito   [facility] [phone]  Case [id number]

## 2020-12-03 NOTE — Progress Notes (Signed)
Central Tele called for me to check on patient because he was reading asystole for a brief moment. Ran to room to find patient reaching for call bell, he stated "I was just about to call you, I felt one of my spells coming on". Verified with central tele that he had a 16.8 sec ventricular pause. Strips documenting the event were loaded into Epic and Dr. Dyann Kief was notified during this period of time as well. Patient is alert and oriented, HR is now a bit brady at 65. He has his daughter at bedside.

## 2020-12-03 NOTE — ED Provider Notes (Signed)
[facility] EMERGENCY DEPARTMENT Provider Note   CSN: [id number] Arrival date & time: 12/02/20  2149     History Chief Complaint  Patient presents with   Seizures    Brett Bryant is a 87 y.o. male.  Patient is an 87 year old male with past medical history of hypertension, GERD, prior CVA.  Patient presents today for concerns of possible seizure activity.  According to the daughter at bedside, he has had multiple episodes this evening consisting of his back straightening up, then him losing consciousness and displaying shaking.  This lasted for a few seconds, then seems to resolve.  She reports that he has had nearly 12 of these episodes in the hour prior to presentation, but seem to have resolved since he has arrived here.  Patient has recently been seen in the ER and admitted for work-up of what were thought to be syncopal episodes, however no definitive cause has been found.  The daughter is concerned that what he has been experiencing may actually be seizures as opposed to syncope.  The history is provided by the patient.      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   GERD (gastroesophageal reflux disease) 10/15/2011   History of colonic polyps 10/15/2011   Hypertension [id number]   Hernia, umbilical [id number]    Past Surgical History:  Procedure Laterality Date   COLONOSCOPY     LIVER BIOPSY     Several Years ago per patient   UPPER GASTROINTESTINAL ENDOSCOPY         Family  History  Problem Relation Age of Onset   Alzheimer's disease Sister    Healthy Sister    Heart disease Sister    Healthy Brother    Heart disease Brother    Kidney disease Brother    Heart disease Brother    Healthy Daughter     Social History   Tobacco Use   Smoking status: Never   Smokeless tobacco: Never  Vaping Use   Vaping Use: Never used  Substance Use Topics   Alcohol use: No   Drug use: No    Home Medications Prior to Admission medications   Medication Sig Start Date End Date Taking? Authorizing Provider  Cholecalciferol (VITAMIN D3) 125 MCG (5000 UT) TABS Take 1,000 Units by mouth daily.    [provider]  Coenzyme Q10 (CO Q 10 PO) Take 200 mg by mouth daily.    [provider]  levofloxacin (LEVAQUIN) 750 MG tablet Take 750 mg by mouth daily. 11/28/20   [provider]  milk thistle 175 MG tablet Take 175 mg by mouth.    [provider]  olmesartan (BENICAR) 40 MG tablet Take 1 tablet (40 mg total) by mouth daily. 11/26/20   Cherene Altes, MD  omeprazole (PRILOSEC) 20 MG capsule Take 20 mg by mouth daily as needed (heartburn).    [provider]  TURMERIC CURCUMIN PO Take 500 mg by mouth daily.    [provider]  vitamin B-12 (CYANOCOBALAMIN) 500 MCG  tablet Take 500 mcg by mouth daily.    [provider]  vitamin E 400 UNIT capsule Take 400 Units by mouth daily.    [provider]    Allergies    Patient has no known allergies.  Review of Systems   Review of Systems  All other systems reviewed and are negative.  Physical Exam Updated Vital Signs BP (!) 161/81 (BP Location: Left Arm)   Pulse 60   Temp 98 F (36.7 C) (Oral)   Resp 20   Ht '5\' 8"'$  (1.727 m)   Wt 83.9 kg   SpO2 100%   BMI 28.13 kg/m   Physical Exam Vitals and nursing note reviewed.  Constitutional:      General: He is not in acute distress.    Appearance: He is well-developed. He is not diaphoretic.  HENT:      Head: Normocephalic and atraumatic.  Cardiovascular:     Rate and Rhythm: Normal rate and regular rhythm.     Heart sounds: No murmur heard.   No friction rub.  Pulmonary:     Effort: Pulmonary effort is normal. No respiratory distress.     Breath sounds: Normal breath sounds. No wheezing or rales.  Abdominal:     General: Bowel sounds are normal. There is no distension.     Palpations: Abdomen is soft.     Tenderness: There is no abdominal tenderness.  Musculoskeletal:        General: Normal range of motion.     Cervical back: Normal range of motion and neck supple.  Skin:    General: Skin is warm and dry.  Neurological:     General: No focal deficit present.     Mental Status: He is alert and oriented to person, place, and time.     Cranial Nerves: No cranial nerve deficit.     Motor: No weakness.     Coordination: Coordination normal.    ED Results / Procedures / Treatments   Labs (all labs ordered are listed, but only abnormal results are displayed) Labs Reviewed  COMPREHENSIVE METABOLIC PANEL  CBC WITH DIFFERENTIAL/PLATELET  ETHANOL    EKG None  Radiology No results found.  Procedures Procedures   Medications Ordered in ED Medications - No data to display  ED Course  I have reviewed the triage vital signs and the nursing notes.  Pertinent labs & imaging results that were available during my care of the patient were reviewed by me and considered in my medical decision making (see chart for details).    MDM Rules/Calculators/A&P  Patient is an 87 year old male presenting here with episodes of shaking and unresponsiveness that have been occurring intermittently for several months.  He had 12 brief episodes during a 1 hour.  According to his daughter who witnessed these.  Upon reviewing his record.  His work-up thus far has been unremarkable with prior visits.  On today's visit, laboratory studies are unremarkable.  Patient has been seen by teleneurology  who does not feel as though these are seizure episodes.  At this point, disposition discussed with patient and his daughter.  She is very uneasy about him returning home given the frequency of these episodes.  I have spoken with Dr. Clearence Ped who agrees to admit.  Final Clinical Impression(s) / ED Diagnoses Final diagnoses:  None    Rx / DC Orders ED Discharge Orders     None        Veryl Speak, MD 12/03/20 [phone]

## 2020-12-03 NOTE — Progress Notes (Signed)
Received report from Luther from [facility]- Pt BP elevated with no medication. I asked her to page MD for this. She called me back to say that MD did not want to treat BP unless symptomatic due to pauses and HR.

## 2020-12-03 NOTE — ED Notes (Signed)
TeleNeuro cart in room for assessment

## 2020-12-03 NOTE — Plan of Care (Signed)
Starting care planning for discharge

## 2020-12-03 NOTE — ED Notes (Signed)
Attempted report x1. 

## 2020-12-04 ENCOUNTER — Encounter (HOSPITAL_COMMUNITY): Payer: Self-pay | Admitting: Family Medicine

## 2020-12-04 DIAGNOSIS — I4430 Unspecified atrioventricular block: Secondary | ICD-10-CM | POA: Diagnosis present

## 2020-12-04 DIAGNOSIS — Z820 Family history of epilepsy and other diseases of the nervous system: Secondary | ICD-10-CM | POA: Diagnosis not present

## 2020-12-04 DIAGNOSIS — K219 Gastro-esophageal reflux disease without esophagitis: Secondary | ICD-10-CM | POA: Diagnosis present

## 2020-12-04 DIAGNOSIS — K2270 Barrett's esophagus without dysplasia: Secondary | ICD-10-CM | POA: Diagnosis present

## 2020-12-04 DIAGNOSIS — Z20822 Contact with and (suspected) exposure to covid-19: Secondary | ICD-10-CM | POA: Diagnosis present

## 2020-12-04 DIAGNOSIS — Z8249 Family history of ischemic heart disease and other diseases of the circulatory system: Secondary | ICD-10-CM | POA: Diagnosis not present

## 2020-12-04 DIAGNOSIS — I442 Atrioventricular block, complete: Secondary | ICD-10-CM | POA: Diagnosis present

## 2020-12-04 DIAGNOSIS — Z79899 Other long term (current) drug therapy: Secondary | ICD-10-CM | POA: Diagnosis not present

## 2020-12-04 DIAGNOSIS — I10 Essential (primary) hypertension: Secondary | ICD-10-CM | POA: Diagnosis present

## 2020-12-04 DIAGNOSIS — R55 Syncope and collapse: Secondary | ICD-10-CM | POA: Diagnosis present

## 2020-12-04 DIAGNOSIS — I455 Other specified heart block: Secondary | ICD-10-CM | POA: Diagnosis not present

## 2020-12-04 DIAGNOSIS — R001 Bradycardia, unspecified: Secondary | ICD-10-CM | POA: Diagnosis present

## 2020-12-04 DIAGNOSIS — K7460 Unspecified cirrhosis of liver: Secondary | ICD-10-CM | POA: Diagnosis present

## 2020-12-04 DIAGNOSIS — Z8673 Personal history of transient ischemic attack (TIA), and cerebral infarction without residual deficits: Secondary | ICD-10-CM | POA: Diagnosis not present

## 2020-12-04 DIAGNOSIS — Z841 Family history of disorders of kidney and ureter: Secondary | ICD-10-CM | POA: Diagnosis not present

## 2020-12-04 LAB — CBC
HCT: 33.8 % — ABNORMAL LOW (ref 39.0–52.0)
Hemoglobin: 11.5 g/dL — ABNORMAL LOW (ref 13.0–17.0)
MCH: 33.7 pg (ref 26.0–34.0)
MCHC: 34 g/dL (ref 30.0–36.0)
MCV: 99.1 fL (ref 80.0–100.0)
Platelets: 149 10*3/uL — ABNORMAL LOW (ref 150–400)
RBC: 3.41 MIL/uL — ABNORMAL LOW (ref 4.22–5.81)
RDW: 13 % (ref 11.5–15.5)
WBC: 6.4 10*3/uL (ref 4.0–10.5)
nRBC: 0 % (ref 0.0–0.2)

## 2020-12-04 LAB — MRSA NEXT GEN BY PCR, NASAL: MRSA by PCR Next Gen: NOT DETECTED

## 2020-12-04 LAB — BASIC METABOLIC PANEL
Anion gap: 5 (ref 5–15)
BUN: 14 mg/dL (ref 8–23)
CO2: 26 mmol/L (ref 22–32)
Calcium: 10.7 mg/dL — ABNORMAL HIGH (ref 8.9–10.3)
Chloride: 105 mmol/L (ref 98–111)
Creatinine, Ser: 0.99 mg/dL (ref 0.61–1.24)
GFR, Estimated: >60 mL/min (ref >60)
Glucose, Bld: 114 mg/dL — ABNORMAL HIGH (ref 70–99)
Potassium: 3.9 mmol/L (ref 3.5–5.1)
Sodium: 136 mmol/L (ref 135–145)

## 2020-12-04 LAB — SURGICAL PCR SCREEN
MRSA, PCR: NEGATIVE
Staphylococcus aureus: NEGATIVE

## 2020-12-04 LAB — TSH: TSH: 1.398 u[IU]/mL (ref 0.350–4.500)

## 2020-12-04 LAB — SARS CORONAVIRUS 2 (TAT 6-24 HRS): SARS Coronavirus 2: NEGATIVE

## 2020-12-04 LAB — MAGNESIUM: Magnesium: 2.1 mg/dL (ref 1.7–2.4)

## 2020-12-04 MED ORDER — SENNOSIDES-DOCUSATE SODIUM 8.6-50 MG PO TABS
1.0000 | ORAL_TABLET | Freq: Two times a day (BID) | ORAL | Status: DC
  Administered 2020-12-04 – 2020-12-06 (×4): 1 via ORAL
  Filled 2020-12-04 (×4): qty 1

## 2020-12-04 MED ORDER — CEFAZOLIN SODIUM-DEXTROSE 2-4 GM/100ML-% IV SOLN
2.0000 g | INTRAVENOUS | Status: AC
  Administered 2020-12-05: 2 g via INTRAVENOUS
  Filled 2020-12-04: qty 100

## 2020-12-04 MED ORDER — HYDRALAZINE HCL 25 MG PO TABS
25.0000 mg | ORAL_TABLET | Freq: Three times a day (TID) | ORAL | Status: DC
  Administered 2020-12-04 – 2020-12-06 (×8): 25 mg via ORAL
  Filled 2020-12-04 (×8): qty 1

## 2020-12-04 MED ORDER — SODIUM CHLORIDE 0.9 % IV SOLN: INTRAVENOUS | Status: DC

## 2020-12-04 MED ORDER — SODIUM CHLORIDE 0.9 % IV SOLN
80.0000 mg | INTRAVENOUS | Status: AC
  Administered 2020-12-05: 80 mg
  Filled 2020-12-04: qty 2

## 2020-12-04 MED ORDER — IRBESARTAN 150 MG PO TABS
150.0000 mg | ORAL_TABLET | Freq: Every day | ORAL | Status: DC
  Administered 2020-12-04 – 2020-12-06 (×2): 150 mg via ORAL
  Filled 2020-12-04 (×2): qty 1

## 2020-12-04 NOTE — Progress Notes (Signed)
PROGRESS NOTE    Brett Bryant  [id number] DOB: 04-23-1933 DOA: 12/02/2020 PCP: Glenda Chroman, MD    Brief Narrative:  87 year old gentleman with history of GERD, hypertension and stroke presented to the emergency room with seizure-like activities.  Patient had been suffering from what looked like transient discomfort, dizziness, uncomfortable feeling and lightheadedness, multiple episodes and has been worked out at [facility].  He was recently admitted. Recent CT scan of the chest with negative pulmonary embolism, vitamin levels normal.  Carotid Dopplers with no significant stenosis.  Echocardiogram with normal ejection fraction, grade 1 diastolic dysfunction.  Thought to have orthostatics, discharged home with outpatient follow-up.  No reported bradycardia or sinus pauses in previous hospitalization. Patient continues to have about 12 of these episodes of back is stiffening, head going backwards and rhythmic jerking of the shoulders lasting about 5 seconds.  He had back-to-back multiple episodes yesterday at home, called his daughter who brought him to the ER.  In the emergency room, hemodynamically stable.  No acute findings. [facility] emergency room patient had similar episode with asystole in the telemetry.  Reported 16.8 seconds ventricular pause.  Transferred to [employer].  Off beta-blocker since 9/10.   Assessment & Plan:   Principal Problem:   Sinus pause Active Problems:   Loss of consciousness (HCC)  Multiple episodes of syncope, possible due to sinus pauses: No overnight events in the hospital.  Heart rate remains about 49-50. Recorded sinus pauses, stripes available from our hospital corresponding to the clinical event. Currently hemodynamically stable.  Not on any rate control medications. Patient will probably need permanent pacemaker, cardiology to follow.  Essential hypertension: Blood pressure elevated.  Recently discontinued all his blood pressure  medications. Resume irbesartan, add hydralazine.  Will avoid beta-blockers.  GERD: On PPI continued.     DVT prophylaxis: heparin injection 5,000 Units Start: 12/03/20 1400 SCDs Start: 12/03/20 0756   Code Status: Full code Family Communication: None Disposition Plan: Status is: Observation  The patient will require care spanning > 2 midnights and should be moved to inpatient because: Ongoing diagnostic testing needed not appropriate for outpatient work up and Inpatient level of care appropriate due to severity of illness  Dispo: The patient is from: Home              Anticipated d/c is to: Home              Patient currently is not medically stable to d/c.   Difficult to place patient No         Consultants:  Cardiology  Procedures:  None  Antimicrobials:  None   Subjective: Patient seen and examined.  No overnight events.  Blood pressure remains elevated.  Patient himself denies any complaints today.  He wants to walk around and I suggested him to get around with nursing staff.  Overnight telemetry without any events.  Objective: Vitals:   12/03/20 2140 12/03/20 2350 12/04/20 0300 12/04/20 0805  BP: (!) 189/76 (!) 145/62 (!) 176/69 (!) 186/72  Pulse: (!) 57 (!) 48 (!) 53 60  Resp: (!) '25 20 18 18  '$ Temp: 98.5 F (36.9 C) 98.5 F (36.9 C) 98.1 F (36.7 C) 98 F (36.7 C)  TempSrc: Oral Oral Oral Oral  SpO2: 97% 97% 98% 96%  Weight: 84.8 kg  84.8 kg   Height: '5\' 8"'$  (1.727 m)       Intake/Output Summary (Last 24 hours) at 12/04/2020 1017 Last data filed at 12/04/2020 1008  Gross per 24 hour  Intake 600 ml  Output 1450 ml  Net -850 ml   Filed Weights   12/02/20 2230 12/03/20 2140 12/04/20 0300  Weight: 83.9 kg 84.8 kg 84.8 kg    Examination:  General exam: Appears calm and comfortable  Respiratory system: Clear to auscultation. Respiratory effort normal. Cardiovascular system: S1 & S2 heard, RRR. No JVD, murmurs, rubs, gallops or clicks. No pedal  edema. Gastrointestinal system: Abdomen is nondistended, soft and nontender. No organomegaly or masses felt. Normal bowel sounds heard. Central nervous system: Alert and oriented. No focal neurological deficits. Extremities: Symmetric 5 x 5 power. Skin: No rashes, lesions or ulcers Psychiatry: Judgement and insight appear normal. Mood & affect appropriate.     Data Reviewed: I have personally reviewed following labs and imaging studies  CBC: Recent Labs  Lab 11/28/20 2300 11/29/20 1655 12/03/20 0150 12/04/20 0008  WBC 5.5 5.4 4.9 6.4  NEUTROABS  --   --  3.2  --   HGB 12.5* 12.7* 12.0* 11.5*  HCT 38.0* 38.4* 36.6* 33.8*  MCV 102.7* 101.1* 103.7* 99.1  PLT 166 177 158 [id number]*   Basic Metabolic Panel: Recent Labs  Lab 11/28/20 2300 11/29/20 1655 12/03/20 0150 12/04/20 0008  NA 135 135 137 136  K 4.3 4.6 4.6 3.9  CL 104 101 105 105  CO2 '26 25 24 26  '$ GLUCOSE 122* 124* 116* 114*  BUN '15 15 16 14  '$ CREATININE 0.95 1.06 1.00 0.99  CALCIUM 10.2 11.3* 10.2 10.7*  MG 2.2  --   --  2.1   GFR: Estimated Creatinine Clearance: 57.9 mL/min (by C-G formula based on SCr of 0.99 mg/dL). Liver Function Tests: Recent Labs  Lab 11/28/20 2300 12/03/20 0150  AST 28 23  ALT 26 21  ALKPHOS 106 93  BILITOT 0.5 0.6  PROT 7.1 6.7  ALBUMIN 3.9 3.9   No results for input(s): LIPASE, AMYLASE in the last 168 hours. No results for input(s): AMMONIA in the last 168 hours. Coagulation Profile: Recent Labs  Lab 11/28/20 2300  INR 1.0   Cardiac Enzymes: No results for input(s): CKTOTAL, CKMB, CKMBINDEX, TROPONINI in the last 168 hours. BNP (last 3 results) No results for input(s): PROBNP in the last 8760 hours. HbA1C: No results for input(s): HGBA1C in the last 72 hours. CBG: No results for input(s): GLUCAP in the last 168 hours. Lipid Profile: No results for input(s): CHOL, HDL, LDLCALC, TRIG, CHOLHDL, LDLDIRECT in the last 72 hours. Thyroid Function Tests: Recent Labs     12/04/20 0008  TSH 1.398   Anemia Panel: No results for input(s): VITAMINB12, FOLATE, FERRITIN, TIBC, IRON, RETICCTPCT in the last 72 hours. Sepsis Labs: No results for input(s): PROCALCITON, LATICACIDVEN in the last 168 hours.  Recent Results (from the past 240 hour(s))  Resp Panel by RT-PCR (Flu A&B, Covid) Nasopharyngeal Swab     Status: None   Collection Time: 11/25/20  4:32 PM   Specimen: Nasopharyngeal Swab; Nasopharyngeal(NP) swabs in vial transport medium  Result Value Ref Range Status   SARS Coronavirus 2 by RT PCR NEGATIVE NEGATIVE Final    Comment: (NOTE) SARS-CoV-2 target nucleic acids are NOT DETECTED.  The SARS-CoV-2 RNA is generally detectable in upper respiratory specimens during the acute phase of infection. The lowest concentration of SARS-CoV-2 viral copies this assay can detect is 138 copies/mL. A negative result does not preclude SARS-Cov-2 infection and should not be used as the sole basis for treatment or other patient management decisions. A negative  result may occur with  improper specimen collection/handling, submission of specimen other than nasopharyngeal swab, presence of viral mutation(s) within the areas targeted by this assay, and inadequate number of viral copies(<138 copies/mL). A negative result must be combined with clinical observations, patient history, and epidemiological information. The expected result is Negative.  Fact Sheet for Patients:  EntrepreneurPulse.com.au  Fact Sheet for Healthcare Providers:  IncredibleEmployment.be  This test is no t yet approved or cleared by the [place] FDA and  has been authorized for detection and/or diagnosis of SARS-CoV-2 by FDA under an Emergency Use Authorization (EUA). This EUA will remain  in effect (meaning this test can be used) for the duration of the COVID-19 declaration under Section 564(b)(1) of the Act, 21 U.S.C.section 360bbb-3(b)(1), unless the  authorization is terminated  or revoked sooner.       Influenza A by PCR NEGATIVE NEGATIVE Final   Influenza B by PCR NEGATIVE NEGATIVE Final    Comment: (NOTE) The Xpert Xpress SARS-CoV-2/FLU/RSV plus assay is intended as an aid in the diagnosis of influenza from Nasopharyngeal swab specimens and should not be used as a sole basis for treatment. Nasal washings and aspirates are unacceptable for Xpert Xpress SARS-CoV-2/FLU/RSV testing.  Fact Sheet for Patients: EntrepreneurPulse.com.au  Fact Sheet for Healthcare Providers: IncredibleEmployment.be  This test is not yet approved or cleared by the [place] FDA and has been authorized for detection and/or diagnosis of SARS-CoV-2 by FDA under an Emergency Use Authorization (EUA). This EUA will remain in effect (meaning this test can be used) for the duration of the COVID-19 declaration under Section 564(b)(1) of the Act, 21 U.S.C. section 360bbb-3(b)(1), unless the authorization is terminated or revoked.  Performed at [facility], [address] [place], [place] [address]   SARS CORONAVIRUS 2 (TAT 6-24 HRS) Nasopharyngeal Nasopharyngeal Swab     Status: None   Collection Time: 12/03/20  5:38 AM   Specimen: Nasopharyngeal Swab  Result Value Ref Range Status   SARS Coronavirus 2 NEGATIVE NEGATIVE Final    Comment: (NOTE) SARS-CoV-2 target nucleic acids are NOT DETECTED.  The SARS-CoV-2 RNA is generally detectable in upper and lower respiratory specimens during the acute phase of infection. Negative results do not preclude SARS-CoV-2 infection, do not rule out co-infections with other pathogens, and should not be used as the sole basis for treatment or other patient management decisions. Negative results must be combined with clinical observations, patient history, and epidemiological information. The expected result is Negative.  Fact Sheet for  Patients: SugarRoll.be  Fact Sheet for Healthcare Providers: https://www.woods-mathews.com/  This test is not yet approved or cleared by the [place] FDA and  has been authorized for detection and/or diagnosis of SARS-CoV-2 by FDA under an Emergency Use Authorization (EUA). This EUA will remain  in effect (meaning this test can be used) for the duration of the COVID-19 declaration under Se ction 564(b)(1) of the Act, 21 U.S.C. section 360bbb-3(b)(1), unless the authorization is terminated or revoked sooner.  Performed at [facility], [place] [address] [place], [place] [address]   MRSA Next Gen by PCR, Nasal     Status: None   Collection Time: 12/03/20  9:59 PM   Specimen: Nasal Mucosa; Nasal Swab  Result Value Ref Range Status   MRSA by PCR Next Gen NOT DETECTED NOT DETECTED Final    Comment: (NOTE) The GeneXpert MRSA Assay (FDA approved for NASAL specimens only), is one component of a comprehensive MRSA colonization surveillance program. It is  not intended to diagnose MRSA infection nor to guide or monitor treatment for MRSA infections. Test performance is not FDA approved in patients less than 80 years old. Performed at [facility], [place] [address], [place], [place] [address]          Radiology Studies: No results found.      Scheduled Meds:  heparin  5,000 Units Subcutaneous Q8H   hydrALAZINE  25 mg Oral Q8H   irbesartan  150 mg Oral Daily   pantoprazole  40 mg Oral Daily   Continuous Infusions:   LOS: 0 days    Time spent: 30 minutes    Barb Merino, MD [facility] Pager [phone]

## 2020-12-04 NOTE — Consult Note (Signed)
Cardiology Consultation:   Patient ID: Brett Bryant MRN: [id number]; DOB: 1934-01-13  Admit date: 12/02/2020 Date of Consult: 12/04/2020  PCP:  Glenda Chroman, MD   [facility] Providers Cardiologist:  None        Patient Profile:   Brett Bryant is a 87 y.o. male with a hx of syncope who is being seen 12/04/2020 for the evaluation of syncope associated with prolonged episodes of heart block and no escape at the request of Ghimire.  History of Present Illness:   Mr. Dorst has been passing out since March. He has been evaluated on several occaisions. He had been on metoprolol but this was stopped in March. He was seen at [facility] as well as [facility]. He was admitted at [facility] for syncope and was on tele when he had a 13 second pause. There is no warning. He will suddenly feel bad and on awakening feels fine. He usually can get to the ground when a spell occurs when he is standing and he has not injured himself. He could not tell me how many times he has passed out. He denies chest pain or sob. He had a 2D echo a week ago which showed normal LV function. His ECG shows left axis. Review of the tele strips demonstrates CHB with a 13 second period of no QRS.    Past Medical History:  Diagnosis Date   Barrett's esophagus 12/15/2015   GERD (gastroesophageal reflux disease)    Hypertension     Past Surgical History:  Procedure Laterality Date   COLONOSCOPY     LIVER BIOPSY     Several Years ago per patient   UPPER GASTROINTESTINAL ENDOSCOPY       Home Medications:  Prior to Admission medications   Medication Sig Start Date End Date Taking? Authorizing Provider  b complex vitamins capsule Take 1 capsule by mouth daily.   Yes [provider]  olmesartan (BENICAR) 40 MG tablet Take 1 tablet (40 mg total) by mouth daily. 11/26/20  Yes Cherene Altes, MD  omeprazole (PRILOSEC) 20 MG capsule Take 20 mg by mouth daily.   Yes [provider]  PUMPKIN SEED PO Take 1  capsule by mouth daily.   Yes [provider]  Saw Palmetto, Serenoa repens, (SAW PALMETTO PO) Take 1 capsule by mouth daily.   Yes [provider]  levofloxacin (LEVAQUIN) 750 MG tablet Take 750 mg by mouth daily. Patient not taking: Reported on 12/04/2020 11/28/20   [provider]    Inpatient Medications: Scheduled Meds:  heparin  5,000 Units Subcutaneous Q8H   hydrALAZINE  25 mg Oral Q8H   irbesartan  150 mg Oral Daily   pantoprazole  40 mg Oral Daily   Continuous Infusions:  PRN Meds: acetaminophen **OR** acetaminophen, ondansetron **OR** ondansetron (ZOFRAN) IV, oxyCODONE  Allergies:   No Known Allergies  Social History:   Social History   Socioeconomic History   Marital status: Widowed    Spouse name: Not on file   Number of children: Not on file   Years of education: Not on file   Highest education level: Not on file  Occupational History   Not on file  Tobacco Use   Smoking status: Never   Smokeless tobacco: Never  Vaping Use   Vaping Use: Never used  Substance and Sexual Activity   Alcohol use: No   Drug use: No   Sexual activity: Not Currently  Other Topics Concern   Not on file  Social History Narrative   Not on file   Social Determinants of Health   Financial Resource Strain: Not on file  Food Insecurity: Not on file  Transportation Needs: Not on file  Physical Activity: Not on file  Stress: Not on file  Social Connections: Not on file  Intimate Partner Violence: Not on file    Family History:    Family History  Problem Relation Age of Onset   Alzheimer's disease Sister    Healthy Sister    Heart disease Sister    Healthy Brother    Heart disease Brother    Kidney disease Brother    Heart disease Brother    Healthy Daughter      ROS:  Please see the history of present illness.   All other ROS reviewed and negative.     Physical Exam/Data:   Vitals:   12/03/20 2140 12/03/20 2350 12/04/20 0300 12/04/20  0805  BP: (!) 189/76 (!) 145/62 (!) 176/69 (!) 186/72  Pulse: (!) 57 (!) 48 (!) 53 60  Resp: (!) '25 20 18 18  '$ Temp: 98.5 F (36.9 C) 98.5 F (36.9 C) 98.1 F (36.7 C) 98 F (36.7 C)  TempSrc: Oral Oral Oral Oral  SpO2: 97% 97% 98% 96%  Weight: 84.8 kg  84.8 kg   Height: '5\' 8"'$  (1.727 m)       Intake/Output Summary (Last 24 hours) at 12/04/2020 1009 Last data filed at 12/04/2020 1008 Gross per 24 hour  Intake 600 ml  Output 1450 ml  Net -850 ml   Last 3 Weights 12/04/2020 12/03/2020 12/02/2020  Weight (lbs) 186 lb 15.2 oz 186 lb 15.2 oz 185 lb  Weight (kg) 84.8 kg 84.8 kg 83.915 kg     Body mass index is 28.43 kg/m.  General:  Well nourished, well developed, in no acute distress HEENT: normal Neck: no JVD Vascular: No carotid bruits; Distal pulses 2+ bilaterally Cardiac:  normal S1, S2; RRR; no murmur  Lungs:  clear to auscultation bilaterally, no wheezing, rhonchi or rales  Abd: soft, nontender, no hepatomegaly  Ext: no edema Musculoskeletal:  No deformities, BUE and BLE strength normal and equal Skin: warm and dry  Neuro:  CNs 2-12 intact, no focal abnormalities noted Psych:  Normal affect   EKG:  The EKG was personally reviewed and demonstrates:  nsr with left axis Telemetry:  Telemetry was personally reviewed and demonstrates:  sinus bradycardia  Relevant CV Studies: 2D echo reviewed  Laboratory Data:  High Sensitivity Troponin:   Recent Labs  Lab 11/26/20 0554 11/28/20 2300 11/29/20 0228 11/29/20 1655 11/29/20 1855  TROPONINIHS 75* 24* 26* 21* 21*     Chemistry Recent Labs  Lab 11/28/20 2300 11/29/20 1655 12/03/20 0150 12/04/20 0008  NA 135 135 137 136  K 4.3 4.6 4.6 3.9  CL 104 101 105 105  CO2 '26 25 24 26  '$ GLUCOSE 122* 124* 116* 114*  BUN '15 15 16 14  '$ CREATININE 0.95 1.06 1.00 0.99  CALCIUM 10.2 11.3* 10.2 10.7*  MG 2.2  --   --  2.1  GFRNONAA >60 >60 >60 >60  ANIONGAP '5 9 8 5    '$ Recent Labs  Lab 11/28/20 2300 12/03/20 0150  PROT 7.1  6.7  ALBUMIN 3.9 3.9  AST 28 23  ALT 26 21  ALKPHOS 106 93  BILITOT 0.5 0.6   Lipids No results for input(s): CHOL, TRIG, HDL, LABVLDL, LDLCALC, CHOLHDL in the last 168 hours.  Hematology Recent Labs  Lab  11/29/20 1655 12/03/20 0150 12/04/20 0008  WBC 5.4 4.9 6.4  RBC 3.80* 3.53* 3.41*  HGB 12.7* 12.0* 11.5*  HCT 38.4* 36.6* 33.8*  MCV 101.1* 103.7* 99.1  MCH 33.4 34.0 33.7  MCHC 33.1 32.8 34.0  RDW 13.1 13.1 13.0  PLT 177 158 149*   Thyroid  Recent Labs  Lab 12/04/20 0008  TSH 1.398    BNP Recent Labs  Lab 11/28/20 2300  BNP 87.0    DDimer No results for input(s): DDIMER in the last 168 hours.   Radiology/Studies:  No results found.   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 While his QRS is fairly narrow, he has first degree AV block and left axis.  I have reviewed the treatment options with the patient and the risks/benefits/goals/expectations of PPM insertion were reviewed and he wishes to proceed. I would recommend bed rest until we can get his PPM inserted. HTN - his bp can be more easily treated with a  beta blocker after his PPM insertion.       For questions or updates, please contact [place] Please consult [email or web address] for contact info under    Signed, [name], MD  12/04/2020 10:09 AM

## 2020-12-05 ENCOUNTER — Encounter (HOSPITAL_COMMUNITY): Payer: Self-pay | Admitting: Internal Medicine

## 2020-12-05 ENCOUNTER — Encounter (HOSPITAL_COMMUNITY)
Admission: EM | Disposition: A | Discharge status: Home / Self Care | Payer: Self-pay | Source: Home / Self Care | Attending: Internal Medicine

## 2020-12-05 ENCOUNTER — Telehealth: Payer: Self-pay | Admitting: Urology

## 2020-12-05 DIAGNOSIS — I442 Atrioventricular block, complete: Secondary | ICD-10-CM | POA: Diagnosis not present

## 2020-12-05 DIAGNOSIS — I455 Other specified heart block: Secondary | ICD-10-CM | POA: Diagnosis not present

## 2020-12-05 DIAGNOSIS — R001 Bradycardia, unspecified: Secondary | ICD-10-CM | POA: Diagnosis not present

## 2020-12-05 HISTORY — PX: PACEMAKER IMPLANT: EP1218

## 2020-12-05 SURGERY — PACEMAKER IMPLANT

## 2020-12-05 MED ORDER — ACETAMINOPHEN 325 MG PO TABS: 325.0000 mg | ORAL_TABLET | ORAL | Status: DC | PRN

## 2020-12-05 MED ORDER — LIDOCAINE HCL (PF) 1 % IJ SOLN
INTRAMUSCULAR | Status: DC | PRN
  Administered 2020-12-05: 50 mL

## 2020-12-05 MED ORDER — MIDAZOLAM HCL 5 MG/5ML IJ SOLN
INTRAMUSCULAR | Status: AC
  Filled 2020-12-05: qty 5

## 2020-12-05 MED ORDER — SODIUM CHLORIDE 0.9 % IV SOLN
INTRAVENOUS | Status: AC
  Filled 2020-12-05: qty 2

## 2020-12-05 MED ORDER — LIDOCAINE HCL 1 % IJ SOLN
INTRAMUSCULAR | Status: AC
  Filled 2020-12-05: qty 60

## 2020-12-05 MED ORDER — MIDAZOLAM HCL 5 MG/5ML IJ SOLN
INTRAMUSCULAR | Status: DC | PRN
  Administered 2020-12-05 (×2): 1 mg via INTRAVENOUS

## 2020-12-05 MED ORDER — CEFAZOLIN SODIUM-DEXTROSE 1-4 GM/50ML-% IV SOLN
1.0000 g | Freq: Four times a day (QID) | INTRAVENOUS | Status: AC
  Administered 2020-12-05 – 2020-12-06 (×3): 1 g via INTRAVENOUS
  Filled 2020-12-05 (×3): qty 50

## 2020-12-05 MED ORDER — HEPARIN (PORCINE) IN NACL 1000-0.9 UT/500ML-% IV SOLN
INTRAVENOUS | Status: AC
  Filled 2020-12-05: qty 500

## 2020-12-05 MED ORDER — ONDANSETRON HCL 4 MG/2ML IJ SOLN: 4.0000 mg | Freq: Four times a day (QID) | INTRAMUSCULAR | Status: DC | PRN

## 2020-12-05 MED ORDER — FENTANYL CITRATE (PF) 100 MCG/2ML IJ SOLN
INTRAMUSCULAR | Status: AC
  Filled 2020-12-05: qty 2

## 2020-12-05 MED ORDER — FENTANYL CITRATE (PF) 100 MCG/2ML IJ SOLN
INTRAMUSCULAR | Status: DC | PRN
  Administered 2020-12-05 (×2): 12.5 ug via INTRAVENOUS

## 2020-12-05 MED ORDER — HEPARIN (PORCINE) IN NACL 1000-0.9 UT/500ML-% IV SOLN
INTRAVENOUS | Status: DC | PRN
  Administered 2020-12-05: 500 mL

## 2020-12-05 MED ORDER — CEFAZOLIN SODIUM-DEXTROSE 2-4 GM/100ML-% IV SOLN
INTRAVENOUS | Status: AC
  Filled 2020-12-05: qty 100

## 2020-12-05 SURGICAL SUPPLY — 10 items
CABLE SURGICAL S-101-97-12 (CABLE) ×2 IMPLANT
CATH SELECT PACE 669183 (CATHETERS) ×1 IMPLANT
CUTTER LV DELIVERY CATHETER 7 (MISCELLANEOUS) ×1 IMPLANT
LEAD TENDRIL MRI 52CM LPA1200M (Lead) ×1 IMPLANT
LEAD TENDRIL STS 2088TC-65 (Lead) ×1 IMPLANT
PACEMAKER ASSURITY DR-RF (Pacemaker) ×1 IMPLANT
PAD PRO RADIOLUCENT 2001M-C (PAD) ×2 IMPLANT
SHEATH 8FR PRELUDE SNAP 13 (SHEATH) ×2 IMPLANT
TRAY PACEMAKER INSERTION (PACKS) ×2 IMPLANT
WIRE HI TORQ VERSACORE-J 145CM (WIRE) ×1 IMPLANT

## 2020-12-05 NOTE — Telephone Encounter (Signed)
Patient's daughter, Royden Purl, called today to report that the patient just had a pacemaker placed.  His cardiologist advised him to wait at least 6 weeks before having a prostate biopsy.  Please reschedule

## 2020-12-05 NOTE — Progress Notes (Addendum)
Progress Note  Patient Name: Brett Bryant Date of Encounter: 12/05/2020  [facility] Cardiologist: new, Dr. Lovena Le  Subjective   No complaints  Inpatient Medications    Scheduled Meds:  gentamicin irrigation  80 mg Irrigation On Call   hydrALAZINE  25 mg Oral Q8H   irbesartan  150 mg Oral Daily   pantoprazole  40 mg Oral Daily   senna-docusate  1 tablet Oral BID   Continuous Infusions:  sodium chloride 50 mL/hr at 12/05/20 0613    ceFAZolin (ANCEF) IV     PRN Meds: acetaminophen **OR** acetaminophen, ondansetron **OR** ondansetron (ZOFRAN) IV, oxyCODONE   Vital Signs    Vitals:   12/04/20 1925 12/05/20 0002 12/05/20 0317 12/05/20 0727  BP: (!) 175/83 137/61 (!) 150/96 (!) 162/82  Pulse: 61 (!) 54 64 60  Resp: 15 (!) 24 20 20   Temp: 98.7 F (37.1 C) 97.8 F (36.6 C) 98.3 F (36.8 C) 98.3 F (36.8 C)  TempSrc: Oral Oral Oral Oral  SpO2: 93% 95% 96% 96%  Weight:   83.8 kg   Height:        Intake/Output Summary (Last 24 hours) at 12/05/2020 0835 Last data filed at 12/04/2020 2100 Gross per 24 hour  Intake 600 ml  Output 1375 ml  Net -775 ml   Last 3 Weights 12/05/2020 12/04/2020 12/03/2020  Weight (lbs) 184 lb 11.9 oz 186 lb 15.2 oz 186 lb 15.2 oz  Weight (kg) 83.8 kg 84.8 kg 84.8 kg      Telemetry    SB high 40's overnight- 50's - Personally Reviewed  ECG    No new EKgs - Personally Reviewed  Physical Exam   GEN: No acute distress.   Neck: No JVD Cardiac: RRR, bradycardic, no murmurs, rubs, or gallops.  Respiratory: CTA b/l. GI: Soft, nontender, non-distended  MS: No edema; No deformity. Neuro:  Nonfocal  Psych: Normal affect   Labs    High Sensitivity Troponin:   Recent Labs  Lab 11/26/20 0554 11/28/20 2300 11/29/20 0228 11/29/20 1655 11/29/20 1855  TROPONINIHS 75* 24* 26* 21* 21*     Chemistry Recent Labs  Lab 11/28/20 2300 11/29/20 1655 12/03/20 0150 12/04/20 0008  NA 135 135 137 136  K 4.3 4.6 4.6 3.9  CL 104 101 105  105  CO2 26 25 24 26   GLUCOSE 122* 124* 116* 114*  BUN 15 15 16 14   CREATININE 0.95 1.06 1.00 0.99  CALCIUM 10.2 11.3* 10.2 10.7*  MG 2.2  --   --  2.1  PROT 7.1  --  6.7  --   ALBUMIN 3.9  --  3.9  --   AST 28  --  23  --   ALT 26  --  21  --   ALKPHOS 106  --  93  --   BILITOT 0.5  --  0.6  --   GFRNONAA >60 >60 >60 >60  ANIONGAP 5 9 8 5     Lipids No results for input(s): CHOL, TRIG, HDL, LABVLDL, LDLCALC, CHOLHDL in the last 168 hours.  Hematology Recent Labs  Lab 11/29/20 1655 12/03/20 0150 12/04/20 0008  WBC 5.4 4.9 6.4  RBC 3.80* 3.53* 3.41*  HGB 12.7* 12.0* 11.5*  HCT 38.4* 36.6* 33.8*  MCV 101.1* 103.7* 99.1  MCH 33.4 34.0 33.7  MCHC 33.1 32.8 34.0  RDW 13.1 13.1 13.0  PLT 177 158 149*   Thyroid  Recent Labs  Lab 12/04/20 0008  TSH 1.398    BNP  Recent Labs  Lab 11/28/20 2300  BNP 87.0    DDimer No results for input(s): DDIMER in the last 168 hours.   Radiology    No results found.  Cardiac Studies    11/26/20: TTE IMPRESSIONS   1. Left ventricular ejection fraction, by estimation, is 60 to 65%. The  left ventricle has normal function. The left ventricle has no regional  wall motion abnormalities. There is mild left ventricular hypertrophy.  Left ventricular diastolic parameters  are consistent with Grade I diastolic dysfunction (impaired relaxation).   2. Right ventricular systolic function is normal. The right ventricular  size is normal.   3. The mitral valve is normal in structure. Mild mitral valve  regurgitation. No evidence of mitral stenosis.   4. The aortic valve has an indeterminant number of cusps. Aortic valve  regurgitation is mild. No aortic stenosis is present.   5. Aortic dilatation noted. There is mild dilatation of the aortic root,  measuring 39 mm.   6. The inferior vena cava is normal in size with greater than 50%  respiratory variability, suggesting right atrial pressure of 3 mmHg.   Patient Profile     87 y.o. male  w/PMHx of HTN, GERD, syncope obsered to have CHB and prolonged symptomatic pauses with no reversible causes found, recommended and planned for PPM today with Dr. Lovena Le  Assessment & Plan    Syncope CHB, pauses Stokes Adams syncope Planned for PPM implant today He has no follow up questions this morning, remains agreeable to proceed.  3. HTN Will add BB post implant   For questions or updates, please contact [place] Please consult [email or web address] for contact info under     Signed, Baldwin Jamaica, PA-C  12/05/2020, 8:35 AM    EP Attending  Patient seen and examined. Agree with above. The patient has been stable overnight. I reviewed the procedure with the patient, all questions answered and we will plan to proceed with PPM insertion.  Carleene Overlie Lama Narayanan,MD

## 2020-12-05 NOTE — Progress Notes (Signed)
PROGRESS NOTE    Brett Bryant  [id number] DOB: 02-Apr-1933 DOA: 12/02/2020 PCP: Glenda Chroman, MD    Brief Narrative:  87 year old gentleman with history of GERD, hypertension and stroke presented to the emergency room with seizure-like activities.  Patient had been suffering from what looked like transient discomfort, dizziness, uncomfortable feeling and lightheadedness, multiple episodes and has been worked out at [facility].  He was recently admitted. Recent CT scan of the chest with negative pulmonary embolism, vitamin levels normal.  Carotid Dopplers with no significant stenosis.  Echocardiogram with normal ejection fraction, grade 1 diastolic dysfunction.  Thought to have orthostatics, discharged home with outpatient follow-up.  No reported bradycardia or sinus pauses in previous hospitalization. Patient continues to have about 12 of these episodes of back is stiffening, head going backwards and rhythmic jerking of the shoulders lasting about 5 seconds.  He had back-to-back multiple episodes yesterday at home, called his daughter who brought him to the ER.  In the emergency room, hemodynamically stable.  No acute findings. [facility] emergency room patient had similar episode with asystole in the telemetry.  Reported 16.8 seconds ventricular pause.  Transferred to [employer].  Off beta-blocker since 9/10.   Assessment & Plan:   Principal Problem:   Sinus pause Active Problems:   Loss of consciousness (HCC)   Heart block atrioventricular  Multiple episodes of syncope, due to sinus pauses. No overnight events in the hospital.  Heart rate remains about 49-50. Recorded sinus pauses, stripes available from our hospital corresponding to the clinical event. Currently hemodynamically stable.  Not on any rate control medications. For permanent pacemaker today.  Essential hypertension: Blood pressure elevated.  Recently discontinued all his blood pressure  medications. Resume irbesartan, add hydralazine.  Beta-blockers after pacemaker.  GERD: On PPI continued.     DVT prophylaxis: Place and maintain sequential compression device Start: 12/05/20 0703 SCDs Start: 12/03/20 0756   Code Status: Full code Family Communication: None Disposition Plan: Status is: Inpatient  Dispo: The patient is from: Home              Anticipated d/c is to: Home              Patient currently is not medically stable to d/c.   Difficult to place patient No         Consultants:  Cardiology  Procedures:  None  Antimicrobials:  None   Subjective: No events.  No events since last 48 hours.  Getting ready to go to pacemaker.  Well-educated and informed about the procedure.  Objective: Vitals:   12/04/20 1925 12/05/20 0002 12/05/20 0317 12/05/20 0727  BP: (!) 175/83 137/61 (!) 150/96 (!) 162/82  Pulse: 61 (!) 54 64 60  Resp: 15 (!) '24 20 20  '$ Temp: 98.7 F (37.1 C) 97.8 F (36.6 C) 98.3 F (36.8 C) 98.3 F (36.8 C)  TempSrc: Oral Oral Oral Oral  SpO2: 93% 95% 96% 96%  Weight:   83.8 kg   Height:        Intake/Output Summary (Last 24 hours) at 12/05/2020 1007 Last data filed at 12/04/2020 2100 Gross per 24 hour  Intake 600 ml  Output 1375 ml  Net -775 ml   Filed Weights   12/03/20 2140 12/04/20 0300 12/05/20 0317  Weight: 84.8 kg 84.8 kg 83.8 kg    Examination:  General: Looks comfortable.  Not in any distress. Cardiovascular: S1-S2 normal.  Regular rate rhythm. Respiratory: Bilateral clear.  No added sounds.  Gastrointestinal: Soft.  Nontender.  Bowel sounds present. Ext: No edema.  No cyanosis. Neuro: Alert oriented x4.    Data Reviewed: I have personally reviewed following labs and imaging studies  CBC: Recent Labs  Lab 11/28/20 2300 11/29/20 1655 12/03/20 0150 12/04/20 0008  WBC 5.5 5.4 4.9 6.4  NEUTROABS  --   --  3.2  --   HGB 12.5* 12.7* 12.0* 11.5*  HCT 38.0* 38.4* 36.6* 33.8*  MCV 102.7* 101.1* 103.7*  99.1  PLT 166 177 158 [id number]*   Basic Metabolic Panel: Recent Labs  Lab 11/28/20 2300 11/29/20 1655 12/03/20 0150 12/04/20 0008  NA 135 135 137 136  K 4.3 4.6 4.6 3.9  CL 104 101 105 105  CO2 '26 25 24 26  '$ GLUCOSE 122* 124* 116* 114*  BUN '15 15 16 14  '$ CREATININE 0.95 1.06 1.00 0.99  CALCIUM 10.2 11.3* 10.2 10.7*  MG 2.2  --   --  2.1   GFR: Estimated Creatinine Clearance: 57.6 mL/min (by C-G formula based on SCr of 0.99 mg/dL). Liver Function Tests: Recent Labs  Lab 11/28/20 2300 12/03/20 0150  AST 28 23  ALT 26 21  ALKPHOS 106 93  BILITOT 0.5 0.6  PROT 7.1 6.7  ALBUMIN 3.9 3.9   No results for input(s): LIPASE, AMYLASE in the last 168 hours. No results for input(s): AMMONIA in the last 168 hours. Coagulation Profile: Recent Labs  Lab 11/28/20 2300  INR 1.0   Cardiac Enzymes: No results for input(s): CKTOTAL, CKMB, CKMBINDEX, TROPONINI in the last 168 hours. BNP (last 3 results) No results for input(s): PROBNP in the last 8760 hours. HbA1C: No results for input(s): HGBA1C in the last 72 hours. CBG: No results for input(s): GLUCAP in the last 168 hours. Lipid Profile: No results for input(s): CHOL, HDL, LDLCALC, TRIG, CHOLHDL, LDLDIRECT in the last 72 hours. Thyroid Function Tests: Recent Labs    12/04/20 0008  TSH 1.398   Anemia Panel: No results for input(s): VITAMINB12, FOLATE, FERRITIN, TIBC, IRON, RETICCTPCT in the last 72 hours. Sepsis Labs: No results for input(s): PROCALCITON, LATICACIDVEN in the last 168 hours.  Recent Results (from the past 240 hour(s))  Resp Panel by RT-PCR (Flu A&B, Covid) Nasopharyngeal Swab     Status: None   Collection Time: 11/25/20  4:32 PM   Specimen: Nasopharyngeal Swab; Nasopharyngeal(NP) swabs in vial transport medium  Result Value Ref Range Status   SARS Coronavirus 2 by RT PCR NEGATIVE NEGATIVE Final    Comment: (NOTE) SARS-CoV-2 target nucleic acids are NOT DETECTED.  The SARS-CoV-2 RNA is generally detectable  in upper respiratory specimens during the acute phase of infection. The lowest concentration of SARS-CoV-2 viral copies this assay can detect is 138 copies/mL. A negative result does not preclude SARS-Cov-2 infection and should not be used as the sole basis for treatment or other patient management decisions. A negative result may occur with  improper specimen collection/handling, submission of specimen other than nasopharyngeal swab, presence of viral mutation(s) within the areas targeted by this assay, and inadequate number of viral copies(<138 copies/mL). A negative result must be combined with clinical observations, patient history, and epidemiological information. The expected result is Negative.  Fact Sheet for Patients:  EntrepreneurPulse.com.au  Fact Sheet for Healthcare Providers:  IncredibleEmployment.be  This test is no t yet approved or cleared by the [place] FDA and  has been authorized for detection and/or diagnosis of SARS-CoV-2 by FDA under an Emergency Use Authorization (EUA). This EUA will remain  in effect (meaning this test can be used) for the duration of the COVID-19 declaration under Section 564(b)(1) of the Act, 21 U.S.C.section 360bbb-3(b)(1), unless the authorization is terminated  or revoked sooner.       Influenza A by PCR NEGATIVE NEGATIVE Final   Influenza B by PCR NEGATIVE NEGATIVE Final    Comment: (NOTE) The Xpert Xpress SARS-CoV-2/FLU/RSV plus assay is intended as an aid in the diagnosis of influenza from Nasopharyngeal swab specimens and should not be used as a sole basis for treatment. Nasal washings and aspirates are unacceptable for Xpert Xpress SARS-CoV-2/FLU/RSV testing.  Fact Sheet for Patients: EntrepreneurPulse.com.au  Fact Sheet for Healthcare Providers: IncredibleEmployment.be  This test is not yet approved or cleared by the [place] FDA and has  been authorized for detection and/or diagnosis of SARS-CoV-2 by FDA under an Emergency Use Authorization (EUA). This EUA will remain in effect (meaning this test can be used) for the duration of the COVID-19 declaration under Section 564(b)(1) of the Act, 21 U.S.C. section 360bbb-3(b)(1), unless the authorization is terminated or revoked.  Performed at [facility], [address] [place], [place] [address]   SARS CORONAVIRUS 2 (TAT 6-24 HRS) Nasopharyngeal Nasal Mucosa     Status: None   Collection Time: 12/03/20  5:38 AM   Specimen: Nasal Mucosa; Nasopharyngeal  Result Value Ref Range Status   SARS Coronavirus 2 NEGATIVE NEGATIVE Final    Comment: (NOTE) SARS-CoV-2 target nucleic acids are NOT DETECTED.  The SARS-CoV-2 RNA is generally detectable in upper and lower respiratory specimens during the acute phase of infection. Negative results do not preclude SARS-CoV-2 infection, do not rule out co-infections with other pathogens, and should not be used as the sole basis for treatment or other patient management decisions. Negative results must be combined with clinical observations, patient history, and epidemiological information. The expected result is Negative.  Fact Sheet for Patients: SugarRoll.be  Fact Sheet for Healthcare Providers: https://www.woods-mathews.com/  This test is not yet approved or cleared by the [place] FDA and  has been authorized for detection and/or diagnosis of SARS-CoV-2 by FDA under an Emergency Use Authorization (EUA). This EUA will remain  in effect (meaning this test can be used) for the duration of the COVID-19 declaration under Se ction 564(b)(1) of the Act, 21 U.S.C. section 360bbb-3(b)(1), unless the authorization is terminated or revoked sooner.  Performed at [facility], [place] [address], [place], [place] [address]   MRSA Next Gen by PCR, Nasal     Status: None   Collection Time:  12/03/20  9:59 PM   Specimen: Nasal Mucosa; Nasal Swab  Result Value Ref Range Status   MRSA by PCR Next Gen NOT DETECTED NOT DETECTED Final    Comment: (NOTE) The GeneXpert MRSA Assay (FDA approved for NASAL specimens only), is one component of a comprehensive MRSA colonization surveillance program. It is not intended to diagnose MRSA infection nor to guide or monitor treatment for MRSA infections. Test performance is not FDA approved in patients less than 52 years old. Performed at [facility], [place] [address], [place],  [address]   Surgical PCR screen     Status: None   Collection Time: 12/04/20  4:19 PM   Specimen: Nasal Mucosa; Nasal Swab  Result Value Ref Range Status   MRSA, PCR NEGATIVE NEGATIVE Final   Staphylococcus aureus NEGATIVE NEGATIVE Final    Comment: (NOTE) The Xpert SA Assay (FDA approved for NASAL specimens in patients 22 years  of age and older), is one component of a comprehensive surveillance program. It is not intended to diagnose infection nor to guide or monitor treatment. Performed at [facility], [place] [address] [place], [place] [address]          Radiology Studies: No results found.      Scheduled Meds:  gentamicin irrigation  80 mg Irrigation On Call   [MAR Hold] hydrALAZINE  25 mg Oral Q8H   [MAR Hold] irbesartan  150 mg Oral Daily   [MAR Hold] pantoprazole  40 mg Oral Daily   [MAR Hold] senna-docusate  1 tablet Oral BID   Continuous Infusions:  sodium chloride 50 mL/hr at 12/05/20 0613     LOS: 1 day    Time spent: 30 minutes    Barb Merino, MD [facility] Pager [phone]

## 2020-12-05 NOTE — Discharge Instructions (Addendum)
    Supplemental Discharge Instructions for  Pacemaker/Defibrillator Patients   Activity No heavy lifting or vigorous activity with your left/right arm for 6 to 8 weeks.  Do not raise your left/right arm above your head for one week.  Gradually raise your affected arm as drawn below.              12/10/20                   12/11/20                    12/12/20                  12/13/20 __  NO DRIVING until cleared to at your wound check visit.  WOUND CARE Keep the wound area clean and dry.  Do not get this area wet , no showers for one week; you may shower on   12/13/20  . The tape/steri-strips on your wound will fall off; do not pull them off.  No bandage is needed on the site.  DO  NOT apply any creams, oils, or ointments to the wound area. If you notice any drainage or discharge from the wound, any swelling or bruising at the site, or you develop a fever > 101? F after you are discharged home, call the office at once.  Special Instructions You are still able to use cellular telephones; use the ear opposite the side where you have your pacemaker/defibrillator.  Avoid carrying your cellular phone near your device. When traveling through airports, show security personnel your identification card to avoid being screened in the metal detectors.  Ask the security personnel to use the hand wand. Avoid arc welding equipment, MRI testing (magnetic resonance imaging), TENS units (transcutaneous nerve stimulators).  Call the office for questions about other devices. Avoid electrical appliances that are in poor condition or are not properly grounded. Microwave ovens are safe to be near or to operate.

## 2020-12-05 NOTE — Telephone Encounter (Signed)
Please advise if you would like to proceed in 6 weeks.

## 2020-12-05 NOTE — Plan of Care (Signed)
  Problem: Education: Goal: Knowledge of General Education information will improve Description: Including pain rating scale, medication(s)/side effects and non-pharmacologic comfort measures Outcome: Progressing   Problem: Clinical Measurements: Goal: Diagnostic test results will improve Outcome: Progressing Goal: Respiratory complications will improve Outcome: Progressing   Problem: Activity: Goal: Risk for activity intolerance will decrease Outcome: Progressing   Problem: Nutrition: Goal: Adequate nutrition will be maintained Outcome: Progressing   Problem: Education: Goal: Ability to safely manage health related needs after discharge will improve Outcome: Progressing

## 2020-12-06 ENCOUNTER — Inpatient Hospital Stay (HOSPITAL_COMMUNITY): Payer: Medicare HMO

## 2020-12-06 DIAGNOSIS — I455 Other specified heart block: Secondary | ICD-10-CM

## 2020-12-06 MED ORDER — METOPROLOL SUCCINATE ER 25 MG PO TB24
25.0000 mg | ORAL_TABLET | Freq: Every day | ORAL | Status: DC
  Administered 2020-12-06: 25 mg via ORAL
  Filled 2020-12-06: qty 1

## 2020-12-06 MED ORDER — METOPROLOL SUCCINATE ER 25 MG PO TB24: 25.0000 mg | ORAL_TABLET | Freq: Every day | ORAL | 0 refills | Status: DC

## 2020-12-06 MED ORDER — HYDRALAZINE HCL 25 MG PO TABS: 25.0000 mg | ORAL_TABLET | Freq: Three times a day (TID) | ORAL | 0 refills | Status: DC

## 2020-12-06 MED ORDER — ACETAMINOPHEN 325 MG PO TABS: 650.0000 mg | ORAL_TABLET | Freq: Four times a day (QID) | ORAL | Status: AC | PRN

## 2020-12-06 MED FILL — Lidocaine HCl Local Inj 1%: INTRAMUSCULAR | Qty: 60 | Status: AC

## 2020-12-06 NOTE — Progress Notes (Addendum)
Progress Note  Patient Name: Brett Bryant Date of Encounter: 12/06/2020  [facility] Cardiologist: new, Dr. Lovena Le  Subjective   Mild ache at his pacer site only, no CP, no SOB  Inpatient Medications    Scheduled Meds:  hydrALAZINE  25 mg Oral Q8H   irbesartan  150 mg Oral Daily   pantoprazole  40 mg Oral Daily   senna-docusate  1 tablet Oral BID   Continuous Infusions:   PRN Meds: acetaminophen **OR** acetaminophen, ondansetron **OR** ondansetron (ZOFRAN) IV, oxyCODONE   Vital Signs    Vitals:   12/06/20 0000 12/06/20 0329 12/06/20 0520 12/06/20 0713  BP: 134/76 132/71  (!) 155/72  Pulse: 68 72  69  Resp: 20 16  18   Temp: 98.9 F (37.2 C) 98.8 F (37.1 C)  97.7 F (36.5 C)  TempSrc: Oral Oral  Oral  SpO2: 94% 94%  94%  Weight:   83.6 kg   Height:        Intake/Output Summary (Last 24 hours) at 12/06/2020 1018 Last data filed at 12/06/2020 0345 Gross per 24 hour  Intake 400 ml  Output 1150 ml  Net -750 ml   Last 3 Weights 12/06/2020 12/05/2020 12/04/2020  Weight (lbs) 184 lb 4.9 oz 184 lb 11.9 oz 186 lb 15.2 oz  Weight (kg) 83.6 kg 83.8 kg 84.8 kg      Telemetry    SR, 1st degree Avblock, occ A pacing - Personally Reviewed  ECG    SR 69bpm, 1st degree AVblock. 26ms - Personally Reviewed  Physical Exam   GEN: No acute distress.   Neck: No JVD Cardiac: RRR, bradycardic, no murmurs, rubs, or gallops.  Respiratory: CTA b/l. GI: Soft, nontender, non-distended  MS: No edema; No deformity. Neuro:  Nonfocal  Psych: Normal affect   PPM site is clean and dry, no hematoma, no bleeding, no ecchymosis  Labs    High Sensitivity Troponin:   Recent Labs  Lab 11/26/20 0554 11/28/20 2300 11/29/20 0228 11/29/20 1655 11/29/20 1855  TROPONINIHS 75* 24* 26* 21* 21*     Chemistry Recent Labs  Lab 11/29/20 1655 12/03/20 0150 12/04/20 0008  NA 135 137 136  K 4.6 4.6 3.9  CL 101 105 105  CO2 25 24 26   GLUCOSE 124* 116* 114*  BUN 15 16 14    CREATININE 1.06 1.00 0.99  CALCIUM 11.3* 10.2 10.7*  MG  --   --  2.1  PROT  --  6.7  --   ALBUMIN  --  3.9  --   AST  --  23  --   ALT  --  21  --   ALKPHOS  --  93  --   BILITOT  --  0.6  --   GFRNONAA >60 >60 >60  ANIONGAP 9 8 5     Lipids No results for input(s): CHOL, TRIG, HDL, LABVLDL, LDLCALC, CHOLHDL in the last 168 hours.  Hematology Recent Labs  Lab 11/29/20 1655 12/03/20 0150 12/04/20 0008  WBC 5.4 4.9 6.4  RBC 3.80* 3.53* 3.41*  HGB 12.7* 12.0* 11.5*  HCT 38.4* 36.6* 33.8*  MCV 101.1* 103.7* 99.1  MCH 33.4 34.0 33.7  MCHC 33.1 32.8 34.0  RDW 13.1 13.1 13.0  PLT 177 158 149*   Thyroid  Recent Labs  Lab 12/04/20 0008  TSH 1.398    BNP No results for input(s): BNP, PROBNP in the last 168 hours.   DDimer No results for input(s): DDIMER in the last 168 hours.  Radiology    DG Chest 2 View  Result Date: 12/06/2020 CLINICAL DATA:  Pacemaker insertion EXAM: CHEST - 2 VIEW COMPARISON:  11/29/2020 FINDINGS: Dual lead pacemaker placed from a left subclavian approach. Leads in the region of the right atrium and right ventricle. No pneumothorax or hemothorax. Chronic cardiomegaly, aortic atherosclerosis and aortic ectasia and tortuosity. The lungs are clear. IMPRESSION: Pacemaker placement as above.  No acute complicating feature. Electronically Signed   By: Nelson Chimes M.D.   On: 12/06/2020 09:18   EP PPM/ICD IMPLANT  Result Date: 12/05/2020 CONCLUSIONS:  1. Successful implantation of a 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 No early apparent complications.       Brett Peru, MD 12/05/2020 11:34 AM     Cardiac Studies    11/26/20: TTE IMPRESSIONS   1. Left ventricular ejection fraction, by estimation, is 60 to 65%. The  left ventricle has normal function. The left ventricle has no regional  wall motion abnormalities. There is mild left ventricular hypertrophy.  Left ventricular diastolic  parameters  are consistent with Grade I diastolic dysfunction (impaired relaxation).   2. Right ventricular systolic function is normal. The right ventricular  size is normal.   3. The mitral valve is normal in structure. Mild mitral valve  regurgitation. No evidence of mitral stenosis.   4. The aortic valve has an indeterminant number of cusps. Aortic valve  regurgitation is mild. No aortic stenosis is present.   5. Aortic dilatation noted. There is mild dilatation of the aortic root,  measuring 39 mm.   6. The inferior vena cava is normal in size with greater than 50%  respiratory variability, suggesting right atrial pressure of 3 mmHg.   Patient Profile     87 y.o. male w/PMHx of HTN, GERD, syncope obsered to have CHB and prolonged symptomatic pauses with no reversible causes found, recommended and planned for PPM today with Dr. Lovena Le  Assessment & Plan    Syncope CHB, pauses [place] syncope  S/p PPM implant yesterday with Dr. Lovena Le Site is stable Device check this Am with stable measurements CXR this Am without ptx Wound care and activity restrictions were reviewed with the patient EP follow up is in place  3. HTN Will add Toprol  Dr. Lovena Le has seen and examined the patient this AM OK to discharge from EP perspective when ready otherwise. We remain available, please recall if needed   For questions or updates, please contact [place] Please consult [email or web address] for contact info under     Signed, Brett [place], PA-C  12/06/2020, 10:18 AM    EP Attending  Patient seen and examined. Agree with above. The patient is doing well after PPM insertion for intermittent CHB due to [employer] attacks. PPM interrogation under my direction demonstrates normal DDD PM function. He is stable for DC home with usual followup.  Carleene Overlie Verdelle Valtierra,MD

## 2020-12-06 NOTE — Telephone Encounter (Signed)
Rescheduled with daughter Jackelyn Poling for a later date in November.

## 2020-12-06 NOTE — Progress Notes (Signed)
RN provided patient with verbal discharge instructions.  Paper copy of discharge summary given to patient. Wife at beside during discharge. IV removed. Pt VSS. Patient belongings sent with patient. Nurse Tech d/c'd patient via wheelchair to [employer]	[place] entrance to private vehicle

## 2020-12-06 NOTE — Plan of Care (Signed)

## 2020-12-06 NOTE — Discharge Summary (Signed)
Physician Discharge Summary  Brett Bryant ASN:[id number] DOB: May 08, 1933 DOA: 12/02/2020  PCP: Glenda Chroman, MD  Admit date: 12/02/2020 Discharge date: 12/06/2020  Admitted From: Home Disposition: Home  Recommendations for Outpatient Follow-up:  Follow up with PCP in 1-2 weeks Cardiology will schedule follow-up.  Home Health: Not applicable Equipment/Devices: Not applicable  Discharge Condition: Stable CODE STATUS: Full code Diet recommendation: Low-salt diet  Discharge summary: 87 years gentleman with history of GERD, hypertension and stroke presented to the emergency room with seizure-like episodes.  Multiple episodes with extensive outpatient work-up.  While in the emergency room, he was found to have seizure-like episode and corresponding 16.8 seconds ventricular pause.  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  Admitted to the hospital.  Underwent permanent pacemaker placement.  Complete heart block with pauses, Stokes-Adams syncope: Permanent pacemaker Dr. Lovena Le 9/19.  Recheck device functional.  No complications. Left hand precautions for 2 weeks.  Tylenol for pain.  Cardiology to schedule follow-up. Now with pacemaker, will tolerate blood pressure medications.  Continue irbesartan, hydralazine 25 mg 3 times a day, started on metoprolol 25 mg daily.  Recheck on follow-up and may need further up titration of blood pressure medications. Other chronic issues remained stable. Adequate to discharge today.   Discharge Diagnoses:  Principal Problem:   Sinus pause Active Problems:   Loss of consciousness ([place])   Heart block atrioventricular    Discharge Instructions  Discharge Instructions     Call MD for:  redness, tenderness, or signs of infection (pain, swelling, redness, odor or green/yellow discharge around incision site)   Complete by: As directed    Call MD for:  severe uncontrolled pain   Complete by: As directed    Call MD for:   temperature >100.4   Complete by: As directed    Diet - low sodium heart healthy   Complete by: As directed    Increase activity slowly   Complete by: As directed    Leave dressing on - Keep it clean, dry, and intact until clinic visit   Complete by: As directed    Lifting restrictions   Complete by: As directed    Left arm precautions for 2 weeks as instructed.      Allergies as of 12/06/2020   No Known Allergies      Medication List     STOP taking these medications    levofloxacin 750 MG tablet Commonly known as: LEVAQUIN       TAKE these medications    acetaminophen 325 MG tablet Commonly known as: TYLENOL Take 2 tablets (650 mg total) by mouth every 6 (six) hours as needed for mild pain (or Fever >/= 101).   b complex vitamins capsule Take 1 capsule by mouth daily.   hydrALAZINE 25 MG tablet Commonly known as: APRESOLINE Take 1 tablet (25 mg total) by mouth every 8 (eight) hours.   metoprolol succinate 25 MG 24 hr tablet Commonly known as: TOPROL-XL Take 1 tablet (25 mg total) by mouth daily.   olmesartan 40 MG tablet Commonly known as: BENICAR Take 1 tablet (40 mg total) by mouth daily.   omeprazole 20 MG capsule Commonly known as: PRILOSEC Take 20 mg by mouth daily.   PUMPKIN SEED PO Take 1 capsule by mouth daily.   SAW PALMETTO PO Take 1 capsule by mouth daily.               Discharge Care Instructions  (From admission, onward)  Start     Ordered   12/06/20 0000  Leave dressing on - Keep it clean, dry, and intact until clinic visit        12/06/20 [facility] Follow up.   Specialty: Cardiology Why: 12/15/20 @ 8:40AM, wound check visit Contact information: [address], [address] [place]        Evans Lance, MD Follow up.   Specialty: Cardiology Why: 03/23/21 @ 11:00AM Contact information: [place] [address] [phone]                No Known Allergies  Consultations: Cardiology   Procedures/Studies: DG Chest 2 View  Result Date: 12/06/2020 CLINICAL DATA:  Pacemaker insertion EXAM: CHEST - 2 VIEW COMPARISON:  11/29/2020 FINDINGS: Dual lead pacemaker placed from a left subclavian approach. Leads in the region of the right atrium and right ventricle. No pneumothorax or hemothorax. Chronic cardiomegaly, aortic atherosclerosis and aortic ectasia and tortuosity. The lungs are clear. IMPRESSION: Pacemaker placement as above.  No acute complicating feature. Electronically Signed   By: Nelson Chimes M.D.   On: 12/06/2020 09:18   DG Chest 2 View  Result Date: 11/29/2020 CLINICAL DATA:  Multiple syncopal episodes, chest pain EXAM: CHEST - 2 VIEW COMPARISON:  11/29/2020 at 12:20 a.m. FINDINGS: Frontal and lateral views of the chest demonstrate a stable cardiac silhouette. Continued aortic atherosclerosis and ectasia. No airspace disease, effusion, or pneumothorax. No acute bony abnormalities. IMPRESSION: 1. Stable chest, no acute process. Electronically Signed   By: Randa Ngo M.D.   On: 11/29/2020 17:39   DG Chest 2 View  Result Date: 11/25/2020 CLINICAL DATA:  Presyncope EXAM: CHEST - 2 VIEW COMPARISON:  None. FINDINGS: Elevation of right diaphragm. No focal consolidation, pleural effusion, or pneumothorax. Probable soft tissue artifact over left lung base. Borderline to mild cardiomegaly with aortic atherosclerosis. No visible pneumothorax. IMPRESSION: No active cardiopulmonary disease.  Borderline to mild cardiomegaly. Electronically Signed   By: Donavan Foil M.D.   On: 11/25/2020 17:19   CT HEAD WO CONTRAST (5MM)  Result Date: 11/29/2020 CLINICAL DATA:  Syncope, facial trauma EXAM: CT HEAD WITHOUT CONTRAST TECHNIQUE: Contiguous axial images were obtained from the base of the skull through the vertex without intravenous contrast. COMPARISON:  07/24/2020  FINDINGS: Brain: No evidence of acute infarction, hemorrhage, hydrocephalus, extra-axial collection or mass lesion/mass effect. Global cortical atrophy. Subcortical white matter and periventricular small vessel ischemic changes. Old left cerebellar infarct (series 2/image 10). Vascular: Intracranial atherosclerosis. Skull: Normal. Negative for fracture or focal lesion. Sinuses/Orbits: The visualized paranasal sinuses are essentially clear. The mastoid air cells are unopacified. Other: None. IMPRESSION: No evidence of acute intracranial abnormality. Atrophy with small vessel ischemic changes. Old left cerebellar infarct. Electronically Signed   By: Julian Hy M.D.   On: 11/29/2020 00:49   CT Angio Chest PE W/Cm &/Or Wo Cm  Result Date: 11/25/2020 CLINICAL DATA:  Syncope.  Elevated D-dimer. EXAM: CT ANGIOGRAPHY CHEST WITH CONTRAST TECHNIQUE: Multidetector CT imaging of the chest was performed using the standard protocol during bolus administration of intravenous contrast. Multiplanar CT image reconstructions and MIPs were obtained to evaluate the vascular anatomy. CONTRAST:  31mL OMNIPAQUE IOHEXOL 350 MG/ML SOLN COMPARISON:  Chest x-ray from same day. Abdominal ultrasound dated August 19, 2018. FINDINGS: Cardiovascular: Satisfactory opacification of the pulmonary arteries to the  segmental level. No evidence of pulmonary embolism. Mild cardiomegaly. No pericardial effusion. Mild aneurysmal dilatation of the ascending thoracic aorta measuring up to 4.2 cm. Coronary, aortic arch, and branch vessel atherosclerotic vascular disease. Occluded proximal left vertebral artery. Mediastinum/Nodes: No enlarged mediastinal, hilar, or axillary lymph nodes. 2.0 cm hypodense nodule in the right thyroid lobe. In the setting of significant comorbidities or limited life expectancy, no follow-up recommended (ref: J Am Coll Radiol. 2015 Feb;12(2): 143-50). Trachea and esophagus demonstrate no significant findings. Lungs/Pleura: No  focal consolidation, pleural effusion, or pneumothorax. No suspicious pulmonary nodule. Upper Abdomen: No acute abnormality. Unchanged cirrhotic appearing liver. Several small gallstones again noted. Musculoskeletal: Bilateral gynecomastia. No acute or significant osseous findings. Chronic T8 compression deformity. Review of the MIP images confirms the above findings. IMPRESSION: 1. No evidence of pulmonary embolism. No acute intrathoracic process. 2. Mild aneurysmal dilatation of the ascending thoracic aorta measuring up to 4.2 cm. Recommend annual imaging followup by CTA or MRA. This recommendation follows 2010 ACCF/AHA/AATS/ACR/ASA/SCA/SCAI/SIR/STS/SVM Guidelines for the Diagnosis and Management of Patients with Thoracic Aortic Disease. Circulation. 2010; 121: H607-P710. Aortic aneurysm NOS (ICD10-I71.9) 3. Occluded proximal left vertebral artery, presumably chronic. 4. Unchanged cirrhosis and cholelithiasis. 5. Aortic Atherosclerosis (ICD10-I70.0). Electronically Signed   By: Titus Dubin M.D.   On: 11/25/2020 18:36   US Carotid Bilateral  Result Date: 11/26/2020 CLINICAL DATA:  87 year old male with a history of syncope EXAM: BILATERAL CAROTID DUPLEX ULTRASOUND TECHNIQUE: Pearline Cables scale imaging, color Doppler and duplex ultrasound were performed of bilateral carotid and vertebral arteries in the neck. COMPARISON:  None. FINDINGS: Criteria: Quantification of carotid stenosis is based on velocity parameters that correlate the residual internal carotid diameter with NASCET-based stenosis levels, using the diameter of the distal internal carotid lumen as the denominator for stenosis measurement. The following velocity measurements were obtained: RIGHT ICA:  Systolic 626 cm/sec, Diastolic 24 cm/sec CCA:  49 cm/sec SYSTOLIC ICA/CCA RATIO:  3.9 ECA:  104 cm/sec LEFT ICA:  Systolic 948 cm/sec, Diastolic 17 cm/sec CCA:  65 cm/sec SYSTOLIC ICA/CCA RATIO:  1.9 ECA:  128 cm/sec Right Brachial SBP: Not acquired Left  Brachial SBP: Not acquired RIGHT CAROTID ARTERY: No significant calcifications of the right common carotid artery. Intermediate waveform maintained. Moderate heterogeneous and partially calcified plaque at the right carotid bifurcation. No significant lumen shadowing. Low resistance waveform of the right ICA. No significant tortuosity. RIGHT VERTEBRAL ARTERY: Antegrade flow with low resistance waveform. LEFT CAROTID ARTERY: No significant calcifications of the left common carotid artery. Intermediate waveform maintained. Moderate heterogeneous and partially calcified plaque at the left carotid bifurcation. No significant lumen shadowing. Low resistance waveform of the left ICA. No significant tortuosity. LEFT VERTEBRAL ARTERY:  Antegrade flow with low resistance waveform. IMPRESSION: Right: Heterogeneous and partially calcified plaque at the right carotid bifurcation contributes to 50%-69% stenosis by established duplex criteria. Left: Color duplex indicates moderate heterogeneous and calcified plaque, with no hemodynamically significant stenosis by duplex criteria in the extracranial cerebrovascular circulation. Signed, Dulcy Fanny. Dellia Nims, RPVI [facility] Electronically Signed   By: Corrie Mckusick D.O.   On: 11/26/2020 10:14   EP PPM/ICD IMPLANT  Result Date: 12/05/2020 CONCLUSIONS:  1. Successful implantation of a 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 No early apparent complications.       [name], MD 12/05/2020 11:34 AM    DG Chest Port 1 View  Result Date: 11/29/2020 CLINICAL DATA:  Shortness of  breath EXAM: PORTABLE CHEST 1 VIEW COMPARISON:  11/25/2020 FINDINGS: Lungs are clear.  No pleural effusion or pneumothorax. The heart is normal in size.  Tortuous aorta. IMPRESSION: No evidence of acute cardiopulmonary disease. Electronically Signed   By: Julian Hy M.D.   On:  11/29/2020 00:50   ECHOCARDIOGRAM COMPLETE  Result Date: 11/26/2020    ECHOCARDIOGRAM REPORT   Patient Name:   DAJOUR PIERPOINT Date of Exam: 11/26/2020 Medical Rec #:  [id number]    Height:       68.0 in Accession #:    [id number]   Weight:       179.0 lb Date of Birth:  01/22/1934    BSA:          1.950 m Patient Age:    15 years     BP:           136/71 mmHg Patient Gender: M            HR:           70 bpm. Exam Location:  [facility] Procedure: 2D Echo, Cardiac Doppler, Color Doppler and Intracardiac            Opacification Agent Indications:    R55 Syncope  History:        Patient has no prior history of Echocardiogram examinations.                 Risk Factors:Hypertension.  Sonographer:    Bernadene Person RDCS Referring Phys: [id number] OLADAPO ADEFESO IMPRESSIONS  1. Left ventricular ejection fraction, by estimation, is 60 to 65%. The left ventricle has normal function. The left ventricle has no regional wall motion abnormalities. There is mild left ventricular hypertrophy. Left ventricular diastolic parameters are consistent with Grade I diastolic dysfunction (impaired relaxation).  2. Right ventricular systolic function is normal. The right ventricular size is normal.  3. The mitral valve is normal in structure. Mild mitral valve regurgitation. No evidence of mitral stenosis.  4. The aortic valve has an indeterminant number of cusps. Aortic valve regurgitation is mild. No aortic stenosis is present.  5. Aortic dilatation noted. There is mild dilatation of the aortic root, measuring 39 mm.  6. The inferior vena cava is normal in size with greater than 50% respiratory variability, suggesting right atrial pressure of 3 mmHg. FINDINGS  Left Ventricle: Left ventricular ejection fraction, by estimation, is 60 to 65%. The left ventricle has normal function. The left ventricle has no regional wall motion abnormalities. Definity contrast agent was given IV to delineate the left ventricular  endocardial borders. The left  ventricular internal cavity size was normal in size. There is mild left ventricular hypertrophy. Left ventricular diastolic parameters are consistent with Grade I diastolic dysfunction (impaired relaxation). Right Ventricle: The right ventricular size is normal. Right ventricular systolic function is normal. Left Atrium: Left atrial size was normal in size. Right Atrium: Right atrial size was normal in size. Pericardium: There is no evidence of pericardial effusion. Mitral Valve: The mitral valve is normal in structure. Mild mitral valve regurgitation. No evidence of mitral valve stenosis. Tricuspid Valve: The tricuspid valve is normal in structure. Tricuspid valve regurgitation is trivial. No evidence of tricuspid stenosis. Aortic Valve: The aortic valve has an indeterminant number of cusps. Aortic valve regurgitation is mild. No aortic stenosis is present. Pulmonic Valve: The pulmonic valve was normal in structure. Pulmonic valve regurgitation is not visualized. No evidence of pulmonic stenosis. Aorta: Aortic dilatation noted. There is mild dilatation  of the aortic root, measuring 39 mm. Venous: The inferior vena cava is normal in size with greater than 50% respiratory variability, suggesting right atrial pressure of 3 mmHg. IAS/Shunts: No atrial level shunt detected by color flow Doppler.  LEFT VENTRICLE PLAX 2D LVIDd:         4.87 cm  Diastology LVIDs:         3.06 cm  LV e' medial:    7.51 cm/s LV PW:         1.14 cm  LV E/e' medial:  9.3 LV IVS:        1.16 cm  LV e' lateral:   11.40 cm/s LVOT diam:     2.10 cm  LV E/e' lateral: 6.1 LV SV:         112 LV SV Index:   58 LVOT Area:     3.46 cm  RIGHT VENTRICLE RV S prime:     17.10 cm/s TAPSE (M-mode): 1.7 cm LEFT ATRIUM             Index       RIGHT ATRIUM           Index LA diam:        3.60 cm 1.85 cm/m  RA Area:     12.40 cm LA Vol (A2C):   45.6 ml 23.39 ml/m RA Volume:   22.40 ml  11.49 ml/m LA Vol (A4C):   51.8 ml 26.57 ml/m LA Biplane Vol: 48.1 ml  24.67 ml/m  AORTIC VALVE LVOT Vmax:   144.00 cm/s LVOT Vmean:  99.000 cm/s LVOT VTI:    0.324 m  AORTA Ao Root diam: 3.90 cm Ao Asc diam:  3.90 cm MITRAL VALVE MV Area (PHT): 2.60 cm     SHUNTS MV Decel Time: 292 msec     Systemic VTI:  0.32 m MV E velocity: 70.00 cm/s   Systemic Diam: 2.10 cm MV A velocity: 109.00 cm/s MV E/A ratio:  0.64 Kirk Ruths MD Electronically signed by Kirk Ruths MD Signature Date/Time: 11/26/2020/12:23:12 PM    Final    (Echo, Carotid, EGD, Colonoscopy, ERCP)    Subjective: Patient seen and examined.  No overnight events.  Eager to go home.  Has some soreness on the incision site otherwise no other complaints.  Does not need any medications to help with the pain.  He can use Tylenol at home.   Discharge Exam: Vitals:   12/06/20 0329 12/06/20 0713  BP: 132/71 (!) 155/72  Pulse: 72 69  Resp: 16 18  Temp: 98.8 F (37.1 C) 97.7 F (36.5 C)  SpO2: 94% 94%   Vitals:   12/06/20 0000 12/06/20 0329 12/06/20 0520 12/06/20 0713  BP: 134/76 132/71  (!) 155/72  Pulse: 68 72  69  Resp: 20 16  18   Temp: 98.9 F (37.2 C) 98.8 F (37.1 C)  97.7 F (36.5 C)  TempSrc: Oral Oral  Oral  SpO2: 94% 94%  94%  Weight:   83.6 kg   Height:        General: Pt is alert, awake, not in acute distress Cardiovascular: RRR, S1/S2 +, no rubs, no gallops Left precordial incision with dressing intact, not removed by me.  Appropriately tender postop with no erythema or swelling.  Distal neurovascular status intact. Respiratory: CTA bilaterally, no wheezing, no rhonchi Abdominal: Soft, NT, ND, bowel sounds + Extremities: no edema, no cyanosis    The results of significant diagnostics from this hospitalization (including imaging, microbiology, ancillary  and laboratory) are listed below for reference.     Microbiology: Recent Results (from the past 240 hour(s))  SARS CORONAVIRUS 2 (TAT 6-24 HRS) Nasopharyngeal Nasal Mucosa     Status: None   Collection Time: 12/03/20  5:38  AM   Specimen: Nasal Mucosa; Nasopharyngeal  Result Value Ref Range Status   SARS Coronavirus 2 NEGATIVE NEGATIVE Final    Comment: (NOTE) SARS-CoV-2 target nucleic acids are NOT DETECTED.  The SARS-CoV-2 RNA is generally detectable in upper and lower respiratory specimens during the acute phase of infection. Negative results do not preclude SARS-CoV-2 infection, do not rule out co-infections with other pathogens, and should not be used as the sole basis for treatment or other patient management decisions. Negative results must be combined with clinical observations, patient history, and epidemiological information. The expected result is Negative.  Fact Sheet for Patients: SugarRoll.be  Fact Sheet for Healthcare Providers: https://www.woods-mathews.com/  This test is not yet approved or cleared by the [place] FDA and  has been authorized for detection and/or diagnosis of SARS-CoV-2 by FDA under an Emergency Use Authorization (EUA). This EUA will remain  in effect (meaning this test can be used) for the duration of the COVID-19 declaration under Se ction 564(b)(1) of the Act, 21 U.S.C. section 360bbb-3(b)(1), unless the authorization is terminated or revoked sooner.  Performed at [facility], [place] [address] [place], [place] [address]   MRSA Next Gen by PCR, Nasal     Status: None   Collection Time: 12/03/20  9:59 PM   Specimen: Nasal Mucosa; Nasal Swab  Result Value Ref Range Status   MRSA by PCR Next Gen NOT DETECTED NOT DETECTED Final    Comment: (NOTE) The GeneXpert MRSA Assay (FDA approved for NASAL specimens only), is one component of a comprehensive MRSA colonization surveillance program. It is not intended to diagnose MRSA infection nor to guide or monitor treatment for MRSA infections. Test performance is not FDA approved in patients less than 16 years old. Performed at [facility], [place] [address]  [place], [place] [address]   Surgical PCR screen     Status: None   Collection Time: 12/04/20  4:19 PM   Specimen: Nasal Mucosa; Nasal Swab  Result Value Ref Range Status   MRSA, PCR NEGATIVE NEGATIVE Final   Staphylococcus aureus NEGATIVE NEGATIVE Final    Comment: (NOTE) The Xpert SA Assay (FDA approved for NASAL specimens in patients 24 years of age and older), is one component of a comprehensive surveillance program. It is not intended to diagnose infection nor to guide or monitor treatment. Performed at [facility], [place] [address] [place], [place] [address]      Labs: BNP (last 3 results) Recent Labs    11/28/20 2300  BNP 91.4   Basic Metabolic Panel: Recent Labs  Lab 11/29/20 1655 12/03/20 0150 12/04/20 0008  NA 135 137 136  K 4.6 4.6 3.9  CL 101 105 105  CO2 25 24 26   GLUCOSE 124* 116* 114*  BUN 15 16 14   CREATININE 1.06 1.00 0.99  CALCIUM 11.3* 10.2 10.7*  MG  --   --  2.1   Liver Function Tests: Recent Labs  Lab 12/03/20 0150  AST 23  ALT 21  ALKPHOS 93  BILITOT 0.6  PROT 6.7  ALBUMIN 3.9   No results for input(s): LIPASE, AMYLASE in the last 168 hours. No results for input(s): AMMONIA in the last 168 hours. CBC: Recent Labs  Lab 11/29/20 1655 12/03/20 0150 12/04/20 0008  WBC 5.4 4.9 6.4  NEUTROABS  --  3.2  --   HGB 12.7* 12.0* 11.5*  HCT 38.4* 36.6* 33.8*  MCV 101.1* 103.7* 99.1  PLT 177 158 149*   Cardiac Enzymes: No results for input(s): CKTOTAL, CKMB, CKMBINDEX, TROPONINI in the last 168 hours. BNP: Invalid input(s): POCBNP CBG: No results for input(s): GLUCAP in the last 168 hours. D-Dimer No results for input(s): DDIMER in the last 72 hours. Hgb A1c No results for input(s): HGBA1C in the last 72 hours. Lipid Profile No results for input(s): CHOL, HDL, LDLCALC, TRIG, CHOLHDL, LDLDIRECT in the last 72 hours. Thyroid function studies Recent Labs    12/04/20 0008  TSH 1.398   Anemia work up No results for input(s):  VITAMINB12, FOLATE, FERRITIN, TIBC, IRON, RETICCTPCT in the last 72 hours. Urinalysis    Component Value Date/Time   COLORURINE YELLOW 12/03/2020 1000   APPEARANCEUR CLOUDY (A) 12/03/2020 1000   APPEARANCEUR Clear 10/18/2020 1358   LABSPEC 1.010 12/03/2020 1000   PHURINE 7.0 12/03/2020 1000   GLUCOSEU NEGATIVE 12/03/2020 1000   HGBUR NEGATIVE 12/03/2020 1000   BILIRUBINUR NEGATIVE 12/03/2020 1000   BILIRUBINUR Negative 10/18/2020 1358   KETONESUR NEGATIVE 12/03/2020 1000   PROTEINUR NEGATIVE 12/03/2020 1000   UROBILINOGEN 0.2 08/25/2019 1031   NITRITE NEGATIVE 12/03/2020 1000   LEUKOCYTESUR NEGATIVE 12/03/2020 1000   Sepsis Labs Invalid input(s): PROCALCITONIN,  WBC,  LACTICIDVEN Microbiology Recent Results (from the past 240 hour(s))  SARS CORONAVIRUS 2 (TAT 6-24 HRS) Nasopharyngeal Nasal Mucosa     Status: None   Collection Time: 12/03/20  5:38 AM   Specimen: Nasal Mucosa; Nasopharyngeal  Result Value Ref Range Status   SARS Coronavirus 2 NEGATIVE NEGATIVE Final    Comment: (NOTE) SARS-CoV-2 target nucleic acids are NOT DETECTED.  The SARS-CoV-2 RNA is generally detectable in upper and lower respiratory specimens during the acute phase of infection. Negative results do not preclude SARS-CoV-2 infection, do not rule out co-infections with other pathogens, and should not be used as the sole basis for treatment or other patient management decisions. Negative results must be combined with clinical observations, patient history, and epidemiological information. The expected result is Negative.  Fact Sheet for Patients: SugarRoll.be  Fact Sheet for Healthcare Providers: https://www.woods-mathews.com/  This test is not yet approved or cleared by the [place] FDA and  has been authorized for detection and/or diagnosis of SARS-CoV-2 by FDA under an Emergency Use Authorization (EUA). This EUA will remain  in effect (meaning this  test can be used) for the duration of the COVID-19 declaration under Se ction 564(b)(1) of the Act, 21 U.S.C. section 360bbb-3(b)(1), unless the authorization is terminated or revoked sooner.  Performed at [facility], [place] [address], [place], [place] [address]   MRSA Next Gen by PCR, Nasal     Status: None   Collection Time: 12/03/20  9:59 PM   Specimen: Nasal Mucosa; Nasal Swab  Result Value Ref Range Status   MRSA by PCR Next Gen NOT DETECTED NOT DETECTED Final    Comment: (NOTE) The GeneXpert MRSA Assay (FDA approved for NASAL specimens only), is one component of a comprehensive MRSA colonization surveillance program. It is not intended to diagnose MRSA infection nor to guide or monitor treatment for MRSA infections. Test performance is not FDA approved in patients less than 47 years old. Performed at [facility], [place] [address] [place], [place] [address]   Surgical  PCR screen     Status: None   Collection Time: 12/04/20  4:19 PM   Specimen: Nasal Mucosa; Nasal Swab  Result Value Ref Range Status   MRSA, PCR NEGATIVE NEGATIVE Final   Staphylococcus aureus NEGATIVE NEGATIVE Final    Comment: (NOTE) The Xpert SA Assay (FDA approved for NASAL specimens in patients 6 years of age and older), is one component of a comprehensive surveillance program. It is not intended to diagnose infection nor to guide or monitor treatment. Performed at [facility], [place] [address], [place], [place] [address]      Time coordinating discharge:  28 minutes  SIGNED:   Barb Merino, MD  [facility] 12/06/2020, 10:38 AM

## 2020-12-13 ENCOUNTER — Ambulatory Visit (HOSPITAL_COMMUNITY): Payer: Medicare HMO

## 2020-12-13 ENCOUNTER — Other Ambulatory Visit: Payer: Medicare HMO | Admitting: Urology

## 2020-12-15 ENCOUNTER — Ambulatory Visit: Payer: Medicare HMO

## 2020-12-15 NOTE — Progress Notes (Deleted)
CARDIOLOGY CONSULT NOTE       Patient ID: Brett Bryant MRN: [id number] DOB/AGE: 1933-12-01 87 y.o.  Admit date: (Not on file) Referring Physician: Vyas Primary Physician: Glenda Chroman, MD Primary Cardiologist: New Reason for Consultation: Abnormal ECG / LBBB  Active Problems:   * No active hospital problems. *   HPI:  87 y.o. referred by Dr Woody Seller for abnormal ECG and LBBB.  History of HTN  After referral made patient seen in hospital by Dr Lovena Le for syncope and CHB and had a St Jude MRI compatible PPM placed on 12/04/20 Telemetry showed CHB with 13 second symptomatic pause Echo 11/26/20 showed EF 60-65% and only mild MR  Carotids done for "syncope" showed 50-69% RICA stenosis   ROS All other systems reviewed and negative except as noted above  Past Medical History:  Diagnosis Date  . Barrett's esophagus 12/15/2015  . GERD (gastroesophageal reflux disease)   . Hypertension     Family History  Problem Relation Age of Onset  . Alzheimer's disease Sister   . Healthy Sister   . Heart disease Sister   . Healthy Brother   . Heart disease Brother   . Kidney disease Brother   . Heart disease Brother   . Healthy Daughter     Social History   Socioeconomic History  . Marital status: Widowed    Spouse name: Not on file  . Number of children: Not on file  . Years of education: Not on file  . Highest education level: Not on file  Occupational History  . Not on file  Tobacco Use  . Smoking status: Never  . Smokeless tobacco: Never  Vaping Use  . Vaping Use: Never used  Substance and Sexual Activity  . Alcohol use: No  . Drug use: No  . Sexual activity: Not Currently  Other Topics Concern  . Not on file  Social History Narrative  . Not on file   Social Determinants of Health   Financial Resource Strain: Not on file  Food Insecurity: Not on file  Transportation Needs: Not on file  Physical Activity: Not on file  Stress: Not on file  Social Connections: Not on file   Intimate Partner Violence: Not on file    Past Surgical History:  Procedure Laterality Date  . COLONOSCOPY    . LIVER BIOPSY     Several Years ago per patient  . PACEMAKER IMPLANT N/A 12/05/2020   Procedure: PACEMAKER IMPLANT;  Surgeon: Evans Lance, MD;  Location: [facility];  Service: Cardiovascular;  Laterality: N/A;  . UPPER GASTROINTESTINAL ENDOSCOPY        Current Outpatient Medications:  .  acetaminophen (TYLENOL) 325 MG tablet, Take 2 tablets (650 mg total) by mouth every 6 (six) hours as needed for mild pain (or Fever >/= 101)., Disp: , Rfl:  .  b complex vitamins capsule, Take 1 capsule by mouth daily., Disp: , Rfl:  .  hydrALAZINE (APRESOLINE) 25 MG tablet, Take 1 tablet (25 mg total) by mouth every 8 (eight) hours., Disp: 90 tablet, Rfl: 0 .  metoprolol succinate (TOPROL-XL) 25 MG 24 hr tablet, Take 1 tablet (25 mg total) by mouth daily., Disp: 30 tablet, Rfl: 0 .  olmesartan (BENICAR) 40 MG tablet, Take 1 tablet (40 mg total) by mouth daily., Disp: 30 tablet, Rfl: 1 .  omeprazole (PRILOSEC) 20 MG capsule, Take 20 mg by mouth daily., Disp: , Rfl:  .  PUMPKIN SEED PO, Take 1 capsule by  mouth daily., Disp: , Rfl:  .  Saw Palmetto, Serenoa repens, (SAW PALMETTO PO), Take 1 capsule by mouth daily., Disp: , Rfl:     Physical Exam: There were no vitals taken for this visit.    Affect appropriate Healthy:  appears stated age 39: normal Neck supple with no adenopathy JVP normal no bruits no thyromegaly Lungs clear with no wheezing and good diaphragmatic motion Heart:  S1/S2 no murmur, no rub, gallop or click PMI normal Recent PPM under left clavicle pocket healing well  Abdomen: benighn, BS positve, no tenderness, no AAA no bruit.  No HSM or HJR Distal pulses intact with no bruits No edema Neuro non-focal Skin warm and dry No muscular weakness   Labs:   Lab Results  Component Value Date   WBC 6.4 12/04/2020   HGB 11.5 (L) 12/04/2020   HCT 33.8 (L)  12/04/2020   MCV 99.1 12/04/2020   PLT 149 (L) 12/04/2020   No results for input(s): NA, K, CL, CO2, BUN, CREATININE, CALCIUM, PROT, BILITOT, ALKPHOS, ALT, AST, GLUCOSE in the last 168 hours.  Invalid input(s): LABALBU No results found for: CKTOTAL, CKMB, CKMBINDEX, TROPONINI  Lab Results  Component Value Date   CHOL 212 (H) 12/16/2014   Lab Results  Component Value Date   HDL 49 12/16/2014   Lab Results  Component Value Date   LDLCALC 134 (H) 12/16/2014   Lab Results  Component Value Date   TRIG 143 12/16/2014   Lab Results  Component Value Date   CHOLHDL 4.3 12/16/2014   No results found for: LDLDIRECT    Radiology: DG Chest 2 View  Result Date: 12/06/2020 CLINICAL DATA:  Pacemaker insertion EXAM: CHEST - 2 VIEW COMPARISON:  11/29/2020 FINDINGS: Dual lead pacemaker placed from a left subclavian approach. Leads in the region of the right atrium and right ventricle. No pneumothorax or hemothorax. Chronic cardiomegaly, aortic atherosclerosis and aortic ectasia and tortuosity. The lungs are clear. IMPRESSION: Pacemaker placement as above.  No acute complicating feature. Electronically Signed   By: Nelson Chimes M.D.   On: 12/06/2020 09:18   DG Chest 2 View  Result Date: 11/29/2020 CLINICAL DATA:  Multiple syncopal episodes, chest pain EXAM: CHEST - 2 VIEW COMPARISON:  11/29/2020 at 12:20 a.m. FINDINGS: Frontal and lateral views of the chest demonstrate a stable cardiac silhouette. Continued aortic atherosclerosis and ectasia. No airspace disease, effusion, or pneumothorax. No acute bony abnormalities. IMPRESSION: 1. Stable chest, no acute process. Electronically Signed   By: Randa Ngo M.D.   On: 11/29/2020 17:39   DG Chest 2 View  Result Date: 11/25/2020 CLINICAL DATA:  Presyncope EXAM: CHEST - 2 VIEW COMPARISON:  None. FINDINGS: Elevation of right diaphragm. No focal consolidation, pleural effusion, or pneumothorax. Probable soft tissue artifact over left lung base.  Borderline to mild cardiomegaly with aortic atherosclerosis. No visible pneumothorax. IMPRESSION: No active cardiopulmonary disease.  Borderline to mild cardiomegaly. Electronically Signed   By: Donavan Foil M.D.   On: 11/25/2020 17:19   CT HEAD WO CONTRAST (5MM)  Result Date: 11/29/2020 CLINICAL DATA:  Syncope, facial trauma EXAM: CT HEAD WITHOUT CONTRAST TECHNIQUE: Contiguous axial images were obtained from the base of the skull through the vertex without intravenous contrast. COMPARISON:  07/24/2020 FINDINGS: Brain: No evidence of acute infarction, hemorrhage, hydrocephalus, extra-axial collection or mass lesion/mass effect. Global cortical atrophy. Subcortical white matter and periventricular small vessel ischemic changes. Old left cerebellar infarct (series 2/image 10). Vascular: Intracranial atherosclerosis. Skull: Normal. Negative for fracture or  focal lesion. Sinuses/Orbits: The visualized paranasal sinuses are essentially clear. The mastoid air cells are unopacified. Other: None. IMPRESSION: No evidence of acute intracranial abnormality. Atrophy with small vessel ischemic changes. Old left cerebellar infarct. Electronically Signed   By: Julian Hy M.D.   On: 11/29/2020 00:49   CT Angio Chest PE W/Cm &/Or Wo Cm  Result Date: 11/25/2020 CLINICAL DATA:  Syncope.  Elevated D-dimer. EXAM: CT ANGIOGRAPHY CHEST WITH CONTRAST TECHNIQUE: Multidetector CT imaging of the chest was performed using the standard protocol during bolus administration of intravenous contrast. Multiplanar CT image reconstructions and MIPs were obtained to evaluate the vascular anatomy. CONTRAST:  73mL OMNIPAQUE IOHEXOL 350 MG/ML SOLN COMPARISON:  Chest x-ray from same day. Abdominal ultrasound dated August 19, 2018. FINDINGS: Cardiovascular: Satisfactory opacification of the pulmonary arteries to the segmental level. No evidence of pulmonary embolism. Mild cardiomegaly. No pericardial effusion. Mild aneurysmal dilatation of the  ascending thoracic aorta measuring up to 4.2 cm. Coronary, aortic arch, and branch vessel atherosclerotic vascular disease. Occluded proximal left vertebral artery. Mediastinum/Nodes: No enlarged mediastinal, hilar, or axillary lymph nodes. 2.0 cm hypodense nodule in the right thyroid lobe. In the setting of significant comorbidities or limited life expectancy, no follow-up recommended (ref: J Am Coll Radiol. 2015 Feb;12(2): 143-50). Trachea and esophagus demonstrate no significant findings. Lungs/Pleura: No focal consolidation, pleural effusion, or pneumothorax. No suspicious pulmonary nodule. Upper Abdomen: No acute abnormality. Unchanged cirrhotic appearing liver. Several small gallstones again noted. Musculoskeletal: Bilateral gynecomastia. No acute or significant osseous findings. Chronic T8 compression deformity. Review of the MIP images confirms the above findings. IMPRESSION: 1. No evidence of pulmonary embolism. No acute intrathoracic process. 2. Mild aneurysmal dilatation of the ascending thoracic aorta measuring up to 4.2 cm. Recommend annual imaging followup by CTA or MRA. This recommendation follows 2010 ACCF/AHA/AATS/ACR/ASA/SCA/SCAI/SIR/STS/SVM Guidelines for the Diagnosis and Management of Patients with Thoracic Aortic Disease. Circulation. 2010; 121: I458-K998. Aortic aneurysm NOS (ICD10-I71.9) 3. Occluded proximal left vertebral artery, presumably chronic. 4. Unchanged cirrhosis and cholelithiasis. 5. Aortic Atherosclerosis (ICD10-I70.0). Electronically Signed   By: Titus Dubin M.D.   On: 11/25/2020 18:36   US Carotid Bilateral  Result Date: 11/26/2020 CLINICAL DATA:  87 year old male with a history of syncope EXAM: BILATERAL CAROTID DUPLEX ULTRASOUND TECHNIQUE: Pearline Cables scale imaging, color Doppler and duplex ultrasound were performed of bilateral carotid and vertebral arteries in the neck. COMPARISON:  None. FINDINGS: Criteria: Quantification of carotid stenosis is based on velocity  parameters that correlate the residual internal carotid diameter with NASCET-based stenosis levels, using the diameter of the distal internal carotid lumen as the denominator for stenosis measurement. The following velocity measurements were obtained: RIGHT ICA:  Systolic 338 cm/sec, Diastolic 24 cm/sec CCA:  49 cm/sec SYSTOLIC ICA/CCA RATIO:  3.9 ECA:  104 cm/sec LEFT ICA:  Systolic 250 cm/sec, Diastolic 17 cm/sec CCA:  65 cm/sec SYSTOLIC ICA/CCA RATIO:  1.9 ECA:  128 cm/sec Right Brachial SBP: Not acquired Left Brachial SBP: Not acquired RIGHT CAROTID ARTERY: No significant calcifications of the right common carotid artery. Intermediate waveform maintained. Moderate heterogeneous and partially calcified plaque at the right carotid bifurcation. No significant lumen shadowing. Low resistance waveform of the right ICA. No significant tortuosity. RIGHT VERTEBRAL ARTERY: Antegrade flow with low resistance waveform. LEFT CAROTID ARTERY: No significant calcifications of the left common carotid artery. Intermediate waveform maintained. Moderate heterogeneous and partially calcified plaque at the left carotid bifurcation. No significant lumen shadowing. Low resistance waveform of the left ICA. No significant tortuosity. LEFT VERTEBRAL ARTERY:  Antegrade flow with low resistance waveform. IMPRESSION: Right: Heterogeneous and partially calcified plaque at the right carotid bifurcation contributes to 50%-69% stenosis by established duplex criteria. Left: Color duplex indicates moderate heterogeneous and calcified plaque, with no hemodynamically significant stenosis by duplex criteria in the extracranial cerebrovascular circulation. Signed, Dulcy Fanny. Dellia Nims, RPVI [facility] Electronically Signed   By: Corrie Mckusick D.O.   On: 11/26/2020 10:14   EP PPM/ICD IMPLANT  Result Date: 12/05/2020 CONCLUSIONS:  1. Successful implantation of a St. Jude dual-chamber  pacemaker for symptomatic bradycardia due to intermittent CHB with syncope  2. No early apparent complications.       [name], MD 12/05/2020 11:34 AM    DG Chest Port 1 View  Result Date: 11/29/2020 CLINICAL DATA:  Shortness of breath EXAM: PORTABLE CHEST 1 VIEW COMPARISON:  11/25/2020 FINDINGS: Lungs are clear.  No pleural effusion or pneumothorax. The heart is normal in size.  Tortuous aorta. IMPRESSION: No evidence of acute cardiopulmonary disease. Electronically Signed   By: Julian Hy M.D.   On: 11/29/2020 00:50   ECHOCARDIOGRAM COMPLETE  Result Date: 11/26/2020    ECHOCARDIOGRAM REPORT   Patient Name:   MAXIMO SPRATLING Date of Exam: 11/26/2020 Medical Rec #:  [id number]    Height:       68.0 in Accession #:    [id number]   Weight:       179.0 lb Date of Birth:  1934-03-05    BSA:          1.950 m Patient Age:    50 years     BP:           136/71 mmHg Patient Gender: M            HR:           70 bpm. Exam Location:  [facility] Procedure: 2D Echo, Cardiac Doppler, Color Doppler and Intracardiac            Opacification Agent Indications:    R55 Syncope  History:        Patient has no prior history of Echocardiogram examinations.                 Risk Factors:Hypertension.  Sonographer:    Bernadene Person RDCS Referring Phys: [id number] OLADAPO ADEFESO IMPRESSIONS  1. Left ventricular ejection fraction, by estimation, is 60 to 65%. The left ventricle has normal function. The left ventricle has no regional wall motion abnormalities. There is mild left ventricular hypertrophy. Left ventricular diastolic parameters are consistent with Grade I diastolic dysfunction (impaired relaxation).  2. Right ventricular systolic function is normal. The right ventricular size is normal.  3. The mitral valve is normal in structure. Mild mitral valve regurgitation. No evidence of mitral stenosis.  4. The aortic valve has an indeterminant number of cusps. Aortic valve regurgitation is mild. No aortic stenosis is present.   5. Aortic dilatation noted. There is mild dilatation of the aortic root, measuring 39 mm.  6. The inferior vena cava is normal in size with greater than 50% respiratory variability, suggesting right atrial pressure of 3 mmHg. FINDINGS  Left Ventricle: Left ventricular ejection fraction, by estimation, is 60 to 65%. The left ventricle has normal function. The left ventricle has no regional wall motion abnormalities. Definity contrast agent was given IV to delineate the left ventricular  endocardial borders. The left ventricular internal cavity size was normal in size. There is mild left ventricular hypertrophy. Left  ventricular diastolic parameters are consistent with Grade I diastolic dysfunction (impaired relaxation). Right Ventricle: The right ventricular size is normal. Right ventricular systolic function is normal. Left Atrium: Left atrial size was normal in size. Right Atrium: Right atrial size was normal in size. Pericardium: There is no evidence of pericardial effusion. Mitral Valve: The mitral valve is normal in structure. Mild mitral valve regurgitation. No evidence of mitral valve stenosis. Tricuspid Valve: The tricuspid valve is normal in structure. Tricuspid valve regurgitation is trivial. No evidence of tricuspid stenosis. Aortic Valve: The aortic valve has an indeterminant number of cusps. Aortic valve regurgitation is mild. No aortic stenosis is present. Pulmonic Valve: The pulmonic valve was normal in structure. Pulmonic valve regurgitation is not visualized. No evidence of pulmonic stenosis. Aorta: Aortic dilatation noted. There is mild dilatation of the aortic root, measuring 39 mm. Venous: The inferior vena cava is normal in size with greater than 50% respiratory variability, suggesting right atrial pressure of 3 mmHg. IAS/Shunts: No atrial level shunt detected by color flow Doppler.  LEFT VENTRICLE PLAX 2D LVIDd:         4.87 cm  Diastology LVIDs:         3.06 cm  LV e' medial:    7.51 cm/s LV  PW:         1.14 cm  LV E/e' medial:  9.3 LV IVS:        1.16 cm  LV e' lateral:   11.40 cm/s LVOT diam:     2.10 cm  LV E/e' lateral: 6.1 LV SV:         112 LV SV Index:   58 LVOT Area:     3.46 cm  RIGHT VENTRICLE RV S prime:     17.10 cm/s TAPSE (M-mode): 1.7 cm LEFT ATRIUM             Index       RIGHT ATRIUM           Index LA diam:        3.60 cm 1.85 cm/m  RA Area:     12.40 cm LA Vol (A2C):   45.6 ml 23.39 ml/m RA Volume:   22.40 ml  11.49 ml/m LA Vol (A4C):   51.8 ml 26.57 ml/m LA Biplane Vol: 48.1 ml 24.67 ml/m  AORTIC VALVE LVOT Vmax:   144.00 cm/s LVOT Vmean:  99.000 cm/s LVOT VTI:    0.324 m  AORTA Ao Root diam: 3.90 cm Ao Asc diam:  3.90 cm MITRAL VALVE MV Area (PHT): 2.60 cm     SHUNTS MV Decel Time: 292 msec     Systemic VTI:  0.32 m MV E velocity: 70.00 cm/s   Systemic Diam: 2.10 cm MV A velocity: 109.00 cm/s MV E/A ratio:  0.64 Kirk Ruths MD Electronically signed by Kirk Ruths MD Signature Date/Time: 11/26/2020/12:23:12 PM    Final     EKG: SR first degree LAFB LAD 12/06/20   ASSESSMENT AND PLAN:   Syncope:  abnormal ECG see below [place] due to CHB PPM:  recent placement ST Jude MRI compatible with normal function F/U with Dr Lovena Le HTN:  Well controlled.  Continue current medications and low sodium Dash type diet.   Cartotid:  50-69% RICA stenosis f/u duplex in a year   F/U with Dr Lovena Le for [facility] Carotid Duplex in a year   Signed: Jenkins Rouge 12/15/2020, 5:28 PM

## 2020-12-16 ENCOUNTER — Other Ambulatory Visit: Payer: Self-pay

## 2020-12-16 ENCOUNTER — Ambulatory Visit (INDEPENDENT_AMBULATORY_CARE_PROVIDER_SITE_OTHER): Payer: Medicare HMO | Admitting: Student

## 2020-12-16 DIAGNOSIS — I455 Other specified heart block: Secondary | ICD-10-CM

## 2020-12-16 LAB — CUP PACEART INCLINIC DEVICE CHECK
Battery Remaining Longevity: 92 mo
Battery Voltage: 3.1 V
Brady Statistic RA Percent Paced: 72 %
Brady Statistic RV Percent Paced: 0.26 %
Date Time Interrogation Session: 20220930094136
Implantable Lead Implant Date: 20220919
Implantable Lead Implant Date: 20220919
Implantable Lead Location: 753859
Implantable Lead Location: 753860
Implantable Pulse Generator Implant Date: 20220919
Lead Channel Impedance Value: 462.5 Ohm
Lead Channel Impedance Value: 625 Ohm
Lead Channel Pacing Threshold Amplitude: 0.5 V
Lead Channel Pacing Threshold Amplitude: 0.5 V
Lead Channel Pacing Threshold Amplitude: 1 V
Lead Channel Pacing Threshold Amplitude: 1 V
Lead Channel Pacing Threshold Pulse Width: 0.5 ms
Lead Channel Pacing Threshold Pulse Width: 0.5 ms
Lead Channel Pacing Threshold Pulse Width: 0.5 ms
Lead Channel Pacing Threshold Pulse Width: 0.5 ms
Lead Channel Sensing Intrinsic Amplitude: 5 mV
Lead Channel Sensing Intrinsic Amplitude: 8.7 mV
Lead Channel Setting Pacing Amplitude: 3.5 V
Lead Channel Setting Pacing Amplitude: 3.5 V
Lead Channel Setting Pacing Pulse Width: 0.5 ms
Lead Channel Setting Sensing Sensitivity: 2 mV
Pulse Gen Model: 2272
Pulse Gen Serial Number: 3964109

## 2020-12-16 NOTE — Progress Notes (Signed)
Wound check appointment. Steri-strips removed. Wound without redness or edema. Incision edges approximated, wound well healed. Normal device function. Thresholds, sensing, and impedances consistent with implant measurements. Device programmed at 3.5V/auto capture programmed on for extra safety margin until 3 month visit. Histogram distribution appropriate for patient and level of activity. 1 short AT episode. Patient educated about wound care, arm mobility, lifting restrictions. ROV in 3 months with Dr. Lovena Le

## 2020-12-20 ENCOUNTER — Ambulatory Visit: Payer: Medicare HMO | Admitting: Cardiovascular Disease

## 2020-12-20 MED ORDER — METOPROLOL SUCCINATE ER 25 MG PO TB24: 25.0000 mg | ORAL_TABLET | Freq: Every day | ORAL | 0 refills | Status: DC

## 2020-12-20 MED ORDER — HYDRALAZINE HCL 25 MG PO TABS: 25.0000 mg | ORAL_TABLET | Freq: Three times a day (TID) | ORAL | 0 refills | Status: DC

## 2021-01-01 NOTE — Progress Notes (Signed)
Cardiology Office Note:   Date:  01/02/2021  NAME:  Brett Bryant    MRN: [id number] DOB:  Dec 02, 1933   PCP:  Glenda Chroman, MD  Cardiologist:  None  Electrophysiologist:  None   Referring MD: Maudie Flakes, MD   Chief Complaint  Patient presents with   Loss of Consciousness    History of Present Illness:   Brett Bryant is a 87 y.o. male with a hx of CHB s/p ppm, HTN, thoracic aortic dilation who is being seen today for the evaluation of syncope at the request of Vyas, Rennis Petty B, MD. he was having recurrent episodes in September.  He was having syncope.  Found to have intermittent complete heart block.  He is status post pacemaker implantation and has had no further symptoms.  His blood pressure is well controlled 140/76.  He is on current medications.  He denies any chest pain or trouble breathing.  No further syncopal episodes with pacemaker implant in place.  He does have a right carotid bruit.  50 to 69% stenosis noted in the right ICA.  He does not have any stroke symptoms.  Seems to be doing well.  He presents with his daughter today.  No history of heart attack or stroke.  He is not diabetic.  No major issues up until this pacemaker implant.  He lives by himself.  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  He does most things for himself.  Someone does come help him clean the house.  Denies any symptoms of chest pain or trouble breathing in the office.  Pacemaker is working quite well.  Problem List SSS/CHB -s/p St. Jude dual chamber ppm 2. HTN 3. Thoracic aortic dilation -42 mm 11/25/2020 4. Carotid artery disease -R ICA 50-69%  Past Medical History: Past Medical History:  Diagnosis Date   Barrett's esophagus 12/15/2015   CHB (complete heart block) (HCC)    GERD (gastroesophageal reflux disease)    Hypertension     Past Surgical History: Past Surgical History:  Procedure Laterality Date   COLONOSCOPY     LIVER BIOPSY     Several Years ago per  patient   PACEMAKER IMPLANT N/A 12/05/2020   Procedure: PACEMAKER IMPLANT;  Surgeon: Evans Lance, MD;  Location: [facility];  Service: Cardiovascular;  Laterality: N/A;   UPPER GASTROINTESTINAL ENDOSCOPY      Current Medications: Current Meds  Medication Sig   acetaminophen (TYLENOL) 325 MG tablet Take 2 tablets (650 mg total) by mouth every 6 (six) hours as needed for mild pain (or Fever >/= 101).   aspirin EC 81 MG tablet Take 1 tablet (81 mg total) by mouth daily. Swallow whole.   atorvastatin (LIPITOR) 10 MG tablet Take 1 tablet (10 mg total) by mouth daily.   b complex vitamins capsule Take 1 capsule by mouth daily.   omeprazole (PRILOSEC) 20 MG capsule Take 20 mg by mouth daily.   PUMPKIN SEED PO Take 1 capsule by mouth daily.   Saw Palmetto, Serenoa repens, (SAW PALMETTO PO) Take 1 capsule by mouth daily.   [DISCONTINUED] hydrALAZINE (APRESOLINE) 25 MG tablet Take 1 tablet (25 mg total) by mouth every 8 (eight) hours.   [DISCONTINUED] metoprolol succinate (TOPROL-XL) 25 MG 24 hr tablet Take 1 tablet (25 mg total) by mouth daily.   [DISCONTINUED] olmesartan (BENICAR) 40 MG tablet Take 1 tablet (40 mg total) by mouth daily.     Allergies:    Patient  has no known allergies.   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  Tobacco Use   Smoking status: Never   Smokeless tobacco: Never  Vaping Use   Vaping Use: Never used  Substance and Sexual Activity   Alcohol use: No   Drug use: No   Sexual activity: Not Currently  Other Topics Concern   Not on file  Social History Narrative   Not on file   Social Determinants of Health   Financial Resource Strain: Not on file  Food Insecurity: Not on file  Transportation Needs: Not on file   Physical Activity: Not on file  Stress: Not on file  Social Connections: Not on file     Family History: The patient's family history includes Alzheimer's disease in his sister; Healthy in his brother, daughter, and sister; Heart disease in his brother, brother, and sister; Kidney disease in his brother.  ROS:   All other ROS reviewed and negative. Pertinent positives noted in the HPI.     EKGs/Labs/Other Studies Reviewed:   The following studies were personally reviewed by me today:  EKG:  EKG is not ordered today.  EKG dated 01/02/2021 was reviewed.  This demonstrates V paced rhythm.  TTE 11/26/2020 1. Left ventricular ejection fraction, by estimation, is 60 to 65%. The  left ventricle has normal function. The left ventricle has no regional  wall motion abnormalities. There is mild left ventricular hypertrophy.  Left ventricular diastolic parameters  are consistent with Grade I diastolic dysfunction (impaired relaxation).   2. Right ventricular systolic function is normal. The right ventricular  size is normal.   3. The mitral valve is normal in structure. Mild mitral valve  regurgitation. No evidence of mitral stenosis.   4. The aortic valve has an indeterminant number of cusps. Aortic valve  regurgitation is mild. No aortic stenosis is present.   5. Aortic dilatation noted. There is mild dilatation of the aortic root,  measuring 39 mm.   6. The inferior vena cava is normal in size with greater than 50%  respiratory variability, suggesting right atrial pressure of 3 mmHg.   Recent Labs: 11/28/2020: B Natriuretic Peptide 87.0 12/03/2020: ALT 21 12/04/2020: BUN 14; Creatinine, Ser 0.99; Hemoglobin 11.5; Magnesium 2.1; Platelets 149; Potassium 3.9; Sodium 136; TSH 1.398   Recent Lipid Panel    Component Value Date/Time   CHOL 212 (H) 12/16/2014 1041   TRIG 143 12/16/2014 1041   HDL 49 12/16/2014 1041   CHOLHDL 4.3 12/16/2014 1041   VLDL 29 12/16/2014 1041   LDLCALC 134 (H)  12/16/2014 1041    Physical Exam:   VS:  BP 140/76   Pulse 85   Ht 5\' 8"  (1.727 m)   Wt 191 lb 3.2 oz (86.7 kg)   SpO2 96%   BMI 29.07 kg/m    Wt Readings from Last 3 Encounters:  01/02/21 191 lb 3.2 oz (86.7 kg)  12/06/20 184 lb 4.9 oz (83.6 kg)  11/29/20 182 lb 15.7 oz (83 kg)    General: Well nourished, well developed, in no acute distress Head: Atraumatic, normal size  Eyes: PEERLA, EOMI  Neck: Supple, no JVD, right bruit noted Endocrine: No thryomegaly Cardiac: Normal S1, S2; RRR; no murmurs, rubs, or  gallops Lungs: Clear to auscultation bilaterally, no wheezing, rhonchi or rales  Abd: Soft, nontender, no hepatomegaly  Ext: No edema, pulses 2+ Musculoskeletal: No deformities, BUE and BLE strength normal and equal Skin: Warm and dry, no rashes   Neuro: Alert and oriented to person, place, time, and situation, CNII-XII grossly intact, no focal deficits  Psych: Normal mood and affect   ASSESSMENT:   Brett Bryant is a 87 y.o. male who presents for the following: 1. Syncope and collapse   2. CHB (complete heart block) (HCC)   3. Primary hypertension   4. Stenosis of right carotid artery     PLAN:   1. Syncope and collapse 2. CHB (complete heart block) (HCC) -Recurrent syncopal events related to complete heart block.  Status post pacemaker implant.  Doing well without further symptoms.  Echo showed normal function.  He will continue to follow with the EP for this.  3. Primary hypertension -BP is well controlled.  We will continue his current medications which include olmesartan 40 mg daily, metoprolol succinate 25 mg daily, hydralazine 25 mg 3 times daily.  4. Stenosis of right carotid artery -Right ICA stenosis 50 to 69%.  I have recommended aspirin and low-dose Lipitor 10 mg daily.  Even though he is 67 I think this is reasonable.  Disposition: Return in about 1 year (around 01/02/2022).  Medication Adjustments/Labs and Tests Ordered: Current medicines are  reviewed at length with the patient today.  Concerns regarding medicines are outlined above.  Orders Placed This Encounter  Procedures   EKG 12-Lead   Meds ordered this encounter  Medications   aspirin EC 81 MG tablet    Sig: Take 1 tablet (81 mg total) by mouth daily. Swallow whole.    Dispense:  90 tablet    Refill:  3   atorvastatin (LIPITOR) 10 MG tablet    Sig: Take 1 tablet (10 mg total) by mouth daily.    Dispense:  90 tablet    Refill:  3   metoprolol succinate (TOPROL-XL) 25 MG 24 hr tablet    Sig: Take 1 tablet (25 mg total) by mouth daily.    Dispense:  90 tablet    Refill:  3   hydrALAZINE (APRESOLINE) 25 MG tablet    Sig: Take 1 tablet (25 mg total) by mouth every 8 (eight) hours.    Dispense:  270 tablet    Refill:  3   olmesartan (BENICAR) 40 MG tablet    Sig: Take 1 tablet (40 mg total) by mouth daily.    Dispense:  90 tablet    Refill:  1    Patient Instructions  Medication Instructions:  Your physician has recommended you make the following change in your medication:  START Aspirin 81 mg tablets daily START Lipitor 10 mg tablets daily  *If you need a refill on your cardiac medications before your next appointment, please call your pharmacy*   Lab Work: None If you have labs (blood work) drawn today and your tests are completely normal, you will receive your results only by: [place] (if you have MyChart) OR A paper copy in the mail If you have any lab test that is abnormal or we need to change your treatment, we will call you to review the results.   Testing/Procedures: None   Follow-Up: At [facility], you and your health needs are our priority.  As part of our continuing mission to provide you with exceptional heart care, we have created designated  Provider Care Teams.  These Care Teams include your primary Cardiologist (physician) and Advanced Practice Providers (APPs -  Physician Assistants and Nurse Practitioners) who all work  together to provide you with the care you need, when you need it.  We recommend signing up for the patient portal called "MyChart".  Sign up information is provided on this After Visit Summary.  MyChart is used to connect with patients for Virtual Visits (Telemedicine).  Patients are able to view lab/test results, encounter notes, upcoming appointments, etc.  Non-urgent messages can be sent to your provider as well.   To learn more about what you can do with MyChart, go to [email or web address].    Your next appointment:   1 year(s)  The format for your next appointment:   In Person  Provider:   Eleonore Chiquito, MD   Other Instructions     Signed, Addison Naegeli. Audie Box, MD, [place]  [address] [place], [place] [address] [phone]  01/02/2021 2:47 PM

## 2021-01-02 ENCOUNTER — Encounter: Payer: Self-pay | Admitting: Cardiovascular Disease

## 2021-01-02 ENCOUNTER — Ambulatory Visit: Payer: Medicare HMO | Admitting: Cardiovascular Disease

## 2021-01-02 ENCOUNTER — Other Ambulatory Visit: Payer: Self-pay

## 2021-01-02 VITALS — BP 140/76 | HR 85 | Ht 68.0 in | Wt 191.2 lb

## 2021-01-02 DIAGNOSIS — I6521 Occlusion and stenosis of right carotid artery: Secondary | ICD-10-CM | POA: Diagnosis not present

## 2021-01-02 DIAGNOSIS — R55 Syncope and collapse: Secondary | ICD-10-CM | POA: Diagnosis not present

## 2021-01-02 DIAGNOSIS — I442 Atrioventricular block, complete: Secondary | ICD-10-CM

## 2021-01-02 DIAGNOSIS — I10 Essential (primary) hypertension: Secondary | ICD-10-CM

## 2021-01-02 MED ORDER — HYDRALAZINE HCL 25 MG PO TABS: 25.0000 mg | ORAL_TABLET | Freq: Three times a day (TID) | ORAL | 3 refills | Status: DC

## 2021-01-02 MED ORDER — ATORVASTATIN CALCIUM 10 MG PO TABS: 10.0000 mg | ORAL_TABLET | Freq: Every day | ORAL | 3 refills | Status: DC

## 2021-01-02 MED ORDER — METOPROLOL SUCCINATE ER 25 MG PO TB24: 25.0000 mg | ORAL_TABLET | Freq: Every day | ORAL | 3 refills | Status: DC

## 2021-01-02 MED ORDER — ASPIRIN EC 81 MG PO TBEC: 81.0000 mg | DELAYED_RELEASE_TABLET | Freq: Every day | ORAL | 3 refills | Status: DC

## 2021-01-02 MED ORDER — OLMESARTAN MEDOXOMIL 40 MG PO TABS: 40.0000 mg | ORAL_TABLET | Freq: Every day | ORAL | 1 refills | Status: DC

## 2021-01-02 NOTE — Patient Instructions (Signed)
Medication Instructions:  Your physician has recommended you make the following change in your medication:  START Aspirin 81 mg tablets daily START Lipitor 10 mg tablets daily  *If you need a refill on your cardiac medications before your next appointment, please call your pharmacy*   Lab Work: None If you have labs (blood work) drawn today and your tests are completely normal, you will receive your results only by: [place] (if you have MyChart) OR A paper copy in the mail If you have any lab test that is abnormal or we need to change your treatment, we will call you to review the results.   Testing/Procedures: None   Follow-Up: At [facility], you and your health needs are our priority.  As part of our continuing mission to provide you with exceptional heart care, we have created designated Provider Care Teams.  These Care Teams include your primary Cardiologist (physician) and Advanced Practice Providers (APPs -  Physician Assistants and Nurse Practitioners) who all work together to provide you with the care you need, when you need it.  We recommend signing up for the patient portal called "MyChart".  Sign up information is provided on this After Visit Summary.  MyChart is used to connect with patients for Virtual Visits (Telemedicine).  Patients are able to view lab/test results, encounter notes, upcoming appointments, etc.  Non-urgent messages can be sent to your provider as well.   To learn more about what you can do with MyChart, go to [email or web address].    Your next appointment:   1 year(s)  The format for your next appointment:   In Person  Provider:   Eleonore Chiquito, MD   Other Instructions

## 2021-01-10 ENCOUNTER — Ambulatory Visit (INDEPENDENT_AMBULATORY_CARE_PROVIDER_SITE_OTHER): Payer: Medicare HMO | Admitting: Internal Medicine

## 2021-01-10 ENCOUNTER — Other Ambulatory Visit: Payer: Self-pay

## 2021-01-10 ENCOUNTER — Encounter (INDEPENDENT_AMBULATORY_CARE_PROVIDER_SITE_OTHER): Payer: Self-pay | Admitting: Internal Medicine

## 2021-01-10 VITALS — BP 176/78 | HR 67 | Temp 97.9°F | Ht 68.0 in | Wt 191.0 lb

## 2021-01-10 DIAGNOSIS — K219 Gastro-esophageal reflux disease without esophagitis: Secondary | ICD-10-CM

## 2021-01-10 DIAGNOSIS — D649 Anemia, unspecified: Secondary | ICD-10-CM | POA: Diagnosis not present

## 2021-01-10 DIAGNOSIS — K2270 Barrett's esophagus without dysplasia: Secondary | ICD-10-CM | POA: Diagnosis not present

## 2021-01-10 DIAGNOSIS — K7469 Other cirrhosis of liver: Secondary | ICD-10-CM | POA: Diagnosis not present

## 2021-01-10 NOTE — Patient Instructions (Signed)
Physician will call with results of blood work and Ultrasound when completed

## 2021-01-10 NOTE — Progress Notes (Signed)
Presenting complaint;  Follow for chronic liver disease.  Patient has cryptogenic cirrhosis. History of GERD complicated by short segment Barrett's esophagus.  Database and subjective:  Patient is 87 year old Caucasian male who is here for scheduled visit accompanied by his daughter Jackelyn Poling.  He was last seen in the office in April 2020 by Ms. Deberah Castle, NP.  He was supposed to come back for follow-up visit in a year.  He states he called our office twice but unable to talk with anyone.  He left message on answering service but did not hear back.  He states presently he is doing well.  He began to have syncopal episodes 6 months ago.  He was seen in emergency room on at least 2 different occasions but was sent home.  He had syncopal episode and fell on 11/29/2020 and sustained laceration to his scalp which required sutures.  He was finally admitted and found to have sinus bradycardia intermittent complete heart block with 13-second pauses and had pacemaker implanted.  He has not had any more syncopal episodes.   He feels heartburn is well controlled with therapy.  He denies dysphagia nausea vomiting sore throat or hoarseness.  He also denies abdominal pain.  His appetite is good.  His weight has remained the same since his last visit 2-1/2 years ago.  He denies melena or rectal bleeding. He states he is scheduled for prostate biopsy with Dr. Diona Fanti next month. He states he is still working.  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  Prior work-up as follows  He was diagnosed with cirrhosis when he presented with new onset of ascites in December 2001.  Other studies were negative.  He had laparoscopic liver biopsy by Dr. Lindalou Hose which revealed stage IV disease felt to be cryptogenic.  His hepatic function improved and his ascites resolved and he has not required any diuretic therapy for years.  Last EGD was in March 2020 for chronic GERD.  He has short segment Barrett's  esophagus.  Last colonoscopy was in March 2010.  He has history of colonic adenomas.  He decided not to have any more exams.   Current Medications: Outpatient Encounter Medications as of 01/10/2021  Medication Sig   acetaminophen (TYLENOL) 325 MG tablet Take 2 tablets (650 mg total) by mouth every 6 (six) hours as needed for mild pain (or Fever >/= 101).   aspirin EC 81 MG tablet Take 1 tablet (81 mg total) by mouth daily. Swallow whole.   atorvastatin (LIPITOR) 10 MG tablet Take 1 tablet (10 mg total) by mouth daily.   b complex vitamins capsule Take 1 capsule by mouth daily.   hydrALAZINE (APRESOLINE) 25 MG tablet Take 1 tablet (25 mg total) by mouth every 8 (eight) hours.   metoprolol succinate (TOPROL-XL) 25 MG 24 hr tablet Take 1 tablet (25 mg total) by mouth daily.   olmesartan (BENICAR) 40 MG tablet Take 1 tablet (40 mg total) by mouth daily.   omeprazole (PRILOSEC) 20 MG capsule Take 20 mg by mouth daily.   PUMPKIN SEED PO Take 1 capsule by mouth daily.   Saw Palmetto, Serenoa repens, (SAW PALMETTO PO) Take 1 capsule by mouth daily.   No facility-administered encounter medications on file as of 01/10/2021.     Objective: Blood pressure (!) 176/78, pulse 67, temperature 97.9 F (36.6 C), temperature source Oral, height '5\' 8"'  (1.727 m), weight 191 lb (86.6 kg). Patient is alert and in no acute distress. He  does not have asterixis. Conjunctiva is pink. Sclera is nonicteric Oropharyngeal mucosa is normal. No neck masses or thyromegaly noted. Cardiac exam with regular rhythm normal S1 and S2. No murmur or gallop noted. Lungs are clear to auscultation. Abdomen.  He has small umbilical hernia and large hernia in mid epigastric region.  Both of these hernias are reducible.  Abdomen is soft without hepatosplenomegaly.  Shifting dullness is absent. No LE edema or clubbing noted.  Labs/studies Results:   CBC Latest Ref Rng & Units 12/04/2020 12/03/2020 11/29/2020  WBC 4.0 - 10.5  K/uL 6.4 4.9 5.4  Hemoglobin 13.0 - 17.0 g/dL 11.5(L) 12.0(L) 12.7(L)  Hematocrit 39.0 - 52.0 % 33.8(L) 36.6(L) 38.4(L)  Platelets 150 - 400 K/uL 149(L) 158 177    CMP Latest Ref Rng & Units 12/04/2020 12/03/2020 11/29/2020  Glucose 70 - 99 mg/dL 114(H) 116(H) 124(H)  BUN 8 - 23 mg/dL '14 16 15  ' Creatinine 0.61 - 1.24 mg/dL 0.99 1.00 1.06  Sodium 135 - 145 mmol/L 136 137 135  Potassium 3.5 - 5.1 mmol/L 3.9 4.6 4.6  Chloride 98 - 111 mmol/L 105 105 101  CO2 22 - 32 mmol/L '26 24 25  ' Calcium 8.9 - 10.3 mg/dL 10.7(H) 10.2 11.3(H)  Total Protein 6.5 - 8.1 g/dL - 6.7 -  Total Bilirubin 0.3 - 1.2 mg/dL - 0.6 -  Alkaline Phos 38 - 126 U/L - 93 -  AST 15 - 41 U/L - 23 -  ALT 0 - 44 U/L - 21 -    Hepatic Function Latest Ref Rng & Units 12/03/2020 11/28/2020 11/26/2020  Total Protein 6.5 - 8.1 g/dL 6.7 7.1 6.6  Albumin 3.5 - 5.0 g/dL 3.9 3.9 3.7  AST 15 - 41 U/L '23 28 25  ' ALT 0 - 44 U/L '21 26 21  ' Alk Phosphatase 38 - 126 U/L 93 106 100  Total Bilirubin 0.3 - 1.2 mg/dL 0.6 0.5 0.6  Bilirubin, Direct 0.0 - 0.2 mg/dL - - -    Recent lab data reviewed.  Assessment:  #1.  History of cryptogenic cirrhosis.  He was diagnosed 21 years ago when he had laparoscopic liver biopsy.  His condition improved and he has not required any diuretic therapy for many years.  He is overdue for screening for [place].  #2.  Chronic GERD complicated by short segment Barrett's esophagus.  Last EGD was in 2010.  He decided not to have any more exams.  He is now 87 years old.  I would therefore continue therapy and monitor his clinical course of symptoms.  #3.  Recent lab studies pertinent for anemia.  He may have lost blood when he has scalp laceration.    Plan:  Patient will go to the lab for CBC and alpha-fetoprotein. Schedule abdominal ultrasound. Office visit in 1 year.

## 2021-01-11 LAB — CBC
HCT: 38.6 % (ref 38.5–50.0)
Hemoglobin: 12.7 g/dL — ABNORMAL LOW (ref 13.2–17.1)
MCH: 32.5 pg (ref 27.0–33.0)
MCHC: 32.9 g/dL (ref 32.0–36.0)
MCV: 98.7 fL (ref 80.0–100.0)
MPV: 11.5 fL (ref 7.5–12.5)
Platelets: 165 10*3/uL (ref 140–400)
RBC: 3.91 10*6/uL — ABNORMAL LOW (ref 4.20–5.80)
RDW: 12.1 % (ref 11.0–15.0)
WBC: 5.3 10*3/uL (ref 3.8–10.8)

## 2021-01-11 LAB — AFP TUMOR MARKER: AFP-Tumor Marker: 4 ng/mL (ref <6.1)

## 2021-01-18 ENCOUNTER — Ambulatory Visit (HOSPITAL_COMMUNITY)
Admission: RE | Admit: 2021-01-18 | Discharge: 2021-01-18 | Disposition: A | Discharge status: Home / Self Care | Payer: Medicare HMO | Source: Ambulatory Visit | Attending: Internal Medicine | Admitting: Internal Medicine

## 2021-01-18 ENCOUNTER — Other Ambulatory Visit: Payer: Self-pay

## 2021-01-18 DIAGNOSIS — K7469 Other cirrhosis of liver: Secondary | ICD-10-CM | POA: Insufficient documentation

## 2021-01-23 NOTE — Progress Notes (Signed)
Pt presents for TRUS/Bx for PSA elevation--most recently 2.Risks, benefits, and some of the potential complications of a transrectal ultrasounds of the prostate (TRUSP) with biopsies were discussed at length with the patient including gross hematuria, blood in the bowel movements, hematospermia, bacteremia, infection, voiding discomfort, urinary retention, fever, chills, sepsis, blood transfusion, death, and others. All questions were answered. Informed consent was obtained. The patient confirmed that he had taken his pre-procedure antibiotic. All anticoagulants were discontinued prior to the procedure. The patient emptied his bladder. He was positioned in a comfortable left lateral decubitus position with hips and knees acutely flexed.  The rectal probe was inserted into the rectum without difficulty. 10cc of 2% Lidocaine without epinephrine was instilled with a spinal needle using ultrasound guidance near the junction of each seminal vesicle and the prostate.  Sequential transverse (axial) scans were made in small increments beginning at the seminal vesicles and ending at the prostatic apex. Sequential longitudinal (saggital) scans were made in small increments beginning at the right lateral prostate and ending at the left lateral prostate. Excellent anatomical imaging was obtained. The peripheral, transitional, and central zones were well-defined. The seminal vesicles were normal.  Prostate volume 41.4 ml.  There were no hypoechoic areas. 12 needle core biopsies were performed. 1 biopsy each was taken from the following areas:  Right lateral base, right medial base, right lateral mid prostate, right medial mid prostate, right lateral apical prostate, right medial apical prostate, left lateral base, left medial base, left lateral mid prostate, left medial mid prostate, left lateral apical prostate, left medial apical prostate.. Minimal prostatic calcifications were noted. Excellent biopsy specimens were  obtained.  Follow-up rectal examination was unremarkable. The procedure was well-tolerated and without complications. Antibiotic instructions were given. The patient was told that:  For several days:  he should increase his fluid intake and limit strenuous activity  he might have mild discomfort at the base of his penis or in his rectum  he might have blood in his urine or blood in his bowel movements  For 2-3 months:  he might have blood in his ejaculate (semen)  Instructions were given to call the office immedicately for blood clots in the urine or bowel movements, difficulty urinating, inability to urinate, urinary retention, painful or frequent urination, fever, chills, nausea, vomiting, or other illness. The patient stated that he understood these instructions and would comply with them. We told the patient that prostate biopsy pathology reports are usually available within 3-5 working days, unless a pathologic second opinion is required, which may take 7-14 days. We told him to contact us to check on the status of his biopsy if he has not heard from [place] within 7 days. The patient left the ultrasound examination room in stable condition.

## 2021-01-24 ENCOUNTER — Ambulatory Visit (INDEPENDENT_AMBULATORY_CARE_PROVIDER_SITE_OTHER): Payer: Medicare HMO | Admitting: Urology

## 2021-01-24 ENCOUNTER — Other Ambulatory Visit: Payer: Self-pay | Admitting: Urology

## 2021-01-24 ENCOUNTER — Encounter (HOSPITAL_COMMUNITY): Payer: Self-pay

## 2021-01-24 ENCOUNTER — Other Ambulatory Visit: Payer: Self-pay

## 2021-01-24 ENCOUNTER — Ambulatory Visit (HOSPITAL_COMMUNITY)
Admission: RE | Admit: 2021-01-24 | Discharge: 2021-01-24 | Disposition: A | Discharge status: Home / Self Care | Payer: Medicare HMO | Source: Ambulatory Visit | Attending: Urology | Admitting: Urology

## 2021-01-24 DIAGNOSIS — C61 Malignant neoplasm of prostate: Secondary | ICD-10-CM

## 2021-01-24 DIAGNOSIS — R972 Elevated prostate specific antigen [PSA]: Secondary | ICD-10-CM | POA: Diagnosis not present

## 2021-01-24 MED ORDER — LIDOCAINE HCL (PF) 2 % IJ SOLN
INTRAMUSCULAR | Status: AC
  Administered 2021-01-24: 10 mL
  Filled 2021-01-24: qty 10

## 2021-01-24 MED ORDER — GENTAMICIN SULFATE 40 MG/ML IJ SOLN
INTRAMUSCULAR | Status: AC
  Administered 2021-01-24: 160 mg
  Filled 2021-01-24: qty 4

## 2021-01-24 NOTE — Progress Notes (Signed)
PT tolerated prostate biopsy procedure and antibiotic injections well today. Labs obtained and sent for pathology. PT ambulatory at discharge with no acute distress noted and verbalized understanding of discharge instructions. Urologist spoke with patient's daughter in the waiting area post procedure.

## 2021-02-13 ENCOUNTER — Telehealth: Payer: Self-pay

## 2021-02-13 NOTE — Telephone Encounter (Signed)
Patient daughter called today for prostate biopsy results. Message sent to MD

## 2021-02-14 ENCOUNTER — Other Ambulatory Visit: Payer: Self-pay | Admitting: Urology

## 2021-02-14 DIAGNOSIS — C61 Malignant neoplasm of prostate: Secondary | ICD-10-CM

## 2021-02-23 ENCOUNTER — Ambulatory Visit (HOSPITAL_COMMUNITY)
Admission: RE | Admit: 2021-02-23 | Discharge: 2021-02-23 | Disposition: A | Discharge status: Home / Self Care | Payer: Medicare HMO | Source: Ambulatory Visit | Attending: Urology | Admitting: Urology

## 2021-02-23 ENCOUNTER — Other Ambulatory Visit: Payer: Medicare HMO

## 2021-02-23 ENCOUNTER — Other Ambulatory Visit: Payer: Self-pay

## 2021-02-23 ENCOUNTER — Encounter (HOSPITAL_COMMUNITY)
Admission: RE | Admit: 2021-02-23 | Discharge: 2021-02-23 | Disposition: A | Discharge status: Home / Self Care | Payer: Medicare HMO | Source: Ambulatory Visit | Attending: Urology | Admitting: Urology

## 2021-02-23 DIAGNOSIS — C61 Malignant neoplasm of prostate: Secondary | ICD-10-CM | POA: Insufficient documentation

## 2021-02-23 MED ORDER — TECHNETIUM TC 99M MEDRONATE IV KIT
20.0000 | PACK | Freq: Once | INTRAVENOUS | Status: AC | PRN
  Administered 2021-02-23: 21.8 via INTRAVENOUS

## 2021-02-24 LAB — COMPREHENSIVE METABOLIC PANEL
ALT: 24 IU/L (ref 0–44)
AST: 27 IU/L (ref 0–40)
Albumin/Globulin Ratio: 1.6 (ref 1.2–2.2)
Albumin: 4.2 g/dL (ref 3.6–4.6)
Alkaline Phosphatase: 129 IU/L — ABNORMAL HIGH (ref 44–121)
BUN/Creatinine Ratio: 16 (ref 10–24)
BUN: 18 mg/dL (ref 8–27)
Bilirubin Total: 0.4 mg/dL (ref 0.0–1.2)
CO2: 25 mmol/L (ref 20–29)
Calcium: 11.2 mg/dL — ABNORMAL HIGH (ref 8.6–10.2)
Chloride: 100 mmol/L (ref 96–106)
Creatinine, Ser: 1.14 mg/dL (ref 0.76–1.27)
Globulin, Total: 2.7 g/dL (ref 1.5–4.5)
Glucose: 87 mg/dL (ref 70–99)
Potassium: 5.6 mmol/L — ABNORMAL HIGH (ref 3.5–5.2)
Sodium: 137 mmol/L (ref 134–144)
Total Protein: 6.9 g/dL (ref 6.0–8.5)
eGFR: 63 mL/min/{1.73_m2} (ref >59)

## 2021-02-28 ENCOUNTER — Ambulatory Visit (HOSPITAL_COMMUNITY)
Admission: RE | Admit: 2021-02-28 | Discharge: 2021-02-28 | Disposition: A | Discharge status: Home / Self Care | Payer: Medicare HMO | Source: Ambulatory Visit | Attending: Urology | Admitting: Urology

## 2021-02-28 DIAGNOSIS — C61 Malignant neoplasm of prostate: Secondary | ICD-10-CM

## 2021-02-28 MED ORDER — IOHEXOL 350 MG/ML SOLN
80.0000 mL | Freq: Once | INTRAVENOUS | Status: AC | PRN
  Administered 2021-02-28: 80 mL via INTRAVENOUS

## 2021-02-28 MED ORDER — SODIUM CHLORIDE (PF) 0.9 % IJ SOLN
INTRAMUSCULAR | Status: AC
  Filled 2021-02-28: qty 50

## 2021-03-01 ENCOUNTER — Telehealth: Payer: Self-pay

## 2021-03-01 NOTE — Telephone Encounter (Signed)
Pt was called, daughter answered the phone where we then discussed his results. An appointment was made for 04/04/2020 for biopsy follow up.

## 2021-03-01 NOTE — Telephone Encounter (Signed)
-----   Message from Franchot Gallo, MD sent at 02/28/2021  4:32 PM EST ----- Let patient know that bone scan looked fine.  His blood chemistries were all normal except slightly high calcium and his potassium is a bit high.  Have him decrease the amount of potassium containing foods that he eats. ----- Message ----- From: Clearence Cheek, CMA Sent: 02/24/2021  10:05 AM EST To: Franchot Gallo, MD  Please review

## 2021-03-07 ENCOUNTER — Telehealth: Payer: Self-pay

## 2021-03-07 ENCOUNTER — Ambulatory Visit (INDEPENDENT_AMBULATORY_CARE_PROVIDER_SITE_OTHER): Payer: Medicare HMO

## 2021-03-07 DIAGNOSIS — I442 Atrioventricular block, complete: Secondary | ICD-10-CM

## 2021-03-07 LAB — CUP PACEART REMOTE DEVICE CHECK
Battery Remaining Longevity: 66 mo
Battery Remaining Percentage: 95.5 %
Battery Voltage: 2.99 V
Brady Statistic AP VP Percent: 52 %
Brady Statistic AP VS Percent: 18 %
Brady Statistic AS VP Percent: 24 %
Brady Statistic AS VS Percent: 5.7 %
Brady Statistic RA Percent Paced: 70 %
Brady Statistic RV Percent Paced: 76 %
Date Time Interrogation Session: 20221220020014
Implantable Lead Implant Date: 20220919
Implantable Lead Implant Date: 20220919
Implantable Lead Location: 753859
Implantable Lead Location: 753860
Implantable Pulse Generator Implant Date: 20220919
Lead Channel Impedance Value: 460 Ohm
Lead Channel Impedance Value: 650 Ohm
Lead Channel Pacing Threshold Amplitude: 0.5 V
Lead Channel Pacing Threshold Amplitude: 1 V
Lead Channel Pacing Threshold Pulse Width: 0.5 ms
Lead Channel Pacing Threshold Pulse Width: 0.5 ms
Lead Channel Sensing Intrinsic Amplitude: 5 mV
Lead Channel Sensing Intrinsic Amplitude: 8.4 mV
Lead Channel Setting Pacing Amplitude: 3.5 V
Lead Channel Setting Pacing Amplitude: 3.5 V
Lead Channel Setting Pacing Pulse Width: 0.5 ms
Lead Channel Setting Sensing Sensitivity: 2 mV
Pulse Gen Model: 2272
Pulse Gen Serial Number: 3964109

## 2021-03-07 NOTE — Telephone Encounter (Signed)
-----   Message from Franchot Gallo, MD sent at 03/07/2021  8:37 AM EST ----- Please call and let the patient/family know that the CT looked fine.  The result actually came to me in urochart.

## 2021-03-07 NOTE — Telephone Encounter (Signed)
Daughter called and made aware. 

## 2021-03-08 ENCOUNTER — Encounter: Payer: Medicare HMO | Admitting: Internal Medicine

## 2021-03-16 NOTE — Progress Notes (Signed)
Remote pacemaker transmission.   

## 2021-03-23 ENCOUNTER — Ambulatory Visit (INDEPENDENT_AMBULATORY_CARE_PROVIDER_SITE_OTHER): Payer: Medicare HMO | Admitting: Internal Medicine

## 2021-03-23 ENCOUNTER — Other Ambulatory Visit: Payer: Self-pay

## 2021-03-23 ENCOUNTER — Encounter: Payer: Self-pay | Admitting: Internal Medicine

## 2021-03-23 VITALS — BP 148/68 | HR 60 | Ht 68.0 in | Wt 201.0 lb

## 2021-03-23 DIAGNOSIS — I442 Atrioventricular block, complete: Secondary | ICD-10-CM | POA: Diagnosis not present

## 2021-03-23 NOTE — Progress Notes (Signed)
HPI Mr. Deringer returns today for followup. He is a pleasant 88 yo man with a h/o [place] syncope and intermittent CHB who underwent PPM insertion 3 months ago. He has done well in the interim with no limitations to physical activity. He denies chest pain or sob. No edema. No Known Allergies   Current Outpatient Medications  Medication Sig Dispense Refill   acetaminophen (TYLENOL) 325 MG tablet Take 2 tablets (650 mg total) by mouth every 6 (six) hours as needed for mild pain (or Fever >/= 101).     aspirin EC 81 MG tablet Take 1 tablet (81 mg total) by mouth daily. Swallow whole. 90 tablet 3   atorvastatin (LIPITOR) 10 MG tablet Take 1 tablet (10 mg total) by mouth daily. 90 tablet 3   b complex vitamins capsule Take 1 capsule by mouth daily.     hydrALAZINE (APRESOLINE) 25 MG tablet Take 1 tablet (25 mg total) by mouth every 8 (eight) hours. 270 tablet 3   metoprolol succinate (TOPROL-XL) 25 MG 24 hr tablet Take 1 tablet (25 mg total) by mouth daily. 90 tablet 3   olmesartan (BENICAR) 40 MG tablet Take 1 tablet (40 mg total) by mouth daily. 90 tablet 1   omeprazole (PRILOSEC) 20 MG capsule Take 20 mg by mouth daily.     PUMPKIN SEED PO Take 1 capsule by mouth daily.     Saw Palmetto, Serenoa repens, (SAW PALMETTO PO) Take 1 capsule by mouth daily.     No current facility-administered medications for this visit.     Past Medical History:  Diagnosis Date   Barrett's esophagus 12/15/2015   CHB (complete heart block) (HCC)    GERD (gastroesophageal reflux disease)    Hypertension     ROS:   All systems reviewed and negative except as noted in the HPI.   Past Surgical History:  Procedure Laterality Date   COLONOSCOPY     LIVER BIOPSY     Several Years ago per patient   PACEMAKER IMPLANT N/A 12/05/2020   Procedure: PACEMAKER IMPLANT;  Surgeon: Evans Lance, MD;  Location: [facility];  Service: Cardiovascular;  Laterality: N/A;   UPPER GASTROINTESTINAL  ENDOSCOPY       Family History  Problem Relation Age of Onset   Alzheimer's disease Sister    Healthy Sister    Heart disease Sister    Healthy Brother    Heart disease Brother    Kidney disease Brother    Heart disease Brother    Healthy Daughter      Social History   Socioeconomic History   Marital status: Widowed    Spouse name: Not on file   Number of children: 1   Years of education: Not on file   Highest education level: Not on file  Occupational History   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  Tobacco Use   Smoking status: Never   Smokeless tobacco: Never  Vaping Use   Vaping Use: Never used  Substance and Sexual Activity   Alcohol use: No   Drug use: No   Sexual activity: Not Currently  Other Topics Concern   Not on file  Social History Narrative   Not on file   Social Determinants of Health   Financial Resource Strain: Not on file  Food Insecurity: Not on file  Transportation Needs: Not on file  Physical Activity: Not on file  Stress: Not on file  Social Connections: Not on file  Intimate Partner Violence: Not on file     BP (!) 148/68    Pulse 60    Ht 5\' 8"  (1.727 m)    Wt 201 lb (91.2 kg)    SpO2 97%    BMI 30.56 kg/m   Physical Exam:  Well appearing NAD HEENT: Unremarkable Neck:  No JVD, no thyromegally Lymphatics:  No adenopathy Back:  No CVA tenderness Lungs:  Clear with no wheezes HEART:  Regular rate rhythm, no murmurs, no rubs, no clicks Abd:  soft, positive bowel sounds, no organomegally, no rebound, no guarding Ext:  2 plus pulses, no edema, no cyanosis, no clubbing Skin:  No rashes no nodules Neuro:  CN II through XII intact, motor grossly intact  DEVICE  Normal device function.  See PaceArt for details.   Assess/Plan:  CHB - he is device dependent today. He is asymptomatic s/p PPM insertion. Syncope - this has resolved since his PPM insertion. PPM -his [employer] Jude DDD PM is working normally and we will recheck in several  months. HTN - he will continue toprol.  Carleene Overlie Abhimanyu Cruces,MD

## 2021-03-23 NOTE — Patient Instructions (Signed)
Medication Instructions:  Your physician recommends that you continue on your current medications as directed. Please refer to the Current Medication list given to you today.  *If you need a refill on your cardiac medications before your next appointment, please call your pharmacy*   Lab Work: NONE   If you have labs (blood work) drawn today and your tests are completely normal, you will receive your results only by: . MyChart Message (if you have MyChart) OR . A paper copy in the mail If you have any lab test that is abnormal or we need to change your treatment, we will call you to review the results.   Testing/Procedures: NONE    Follow-Up: At [facility], you and your health needs are our priority.  As part of our continuing mission to provide you with exceptional heart care, we have created designated Provider Care Teams.  These Care Teams include your primary Cardiologist (physician) and Advanced Practice Providers (APPs -  Physician Assistants and Nurse Practitioners) who all work together to provide you with the care you need, when you need it.  We recommend signing up for the patient portal called "MyChart".  Sign up information is provided on this After Visit Summary.  MyChart is used to connect with patients for Virtual Visits (Telemedicine).  Patients are able to view lab/test results, encounter notes, upcoming appointments, etc.  Non-urgent messages can be sent to your provider as well.   To learn more about what you can do with MyChart, go to [email or web address].    Your next appointment:   1 year(s)  The format for your next appointment:   In Person  Provider:   Gregg Taylor, MD   Other Instructions Thank you for choosing [facility]!    

## 2021-04-02 NOTE — Progress Notes (Signed)
History of Present Illness: 88 yo m0.31ale presents for PCa discussion.  Underwent TRUS/Bx 11.8.2022. Prostate volume 41.4 mL, PSA 13, PSAD 0.31. Bx results as follows:   12.8.2022: Bone scan IMPRESSION: Intense uptake within the T6 and T8 vertebral bodies corresponding to fractures noted on prior CT examination. Correlation with clinical examination for point tenderness is recommended. These would be better assessed with MRI examination, particularly if vertebroplasty is to be considered for vertebral stabilization.   No osseous metastatic disease.  12.13.2022: CT A/P;  IMPRESSION: 1. No acute findings identified within the abdomen or pelvis. No mass or adenopathy to suggest metastatic disease. 2. Small exophytic lesion arising off the lateral cortex of the upper pole of left kidney measures 8 mm and is technically too small to characterize. Attention on follow-up imaging advised. 3. Inferior pole right kidney cyst. 4. Gallstones. 5. Supraumbilical ventral abdominal wall hernia contains fat only. 6. Aortic Atherosclerosis (ICD10-I70.0).  1.17.2023: Here for PCa consult    Past Medical History:  Diagnosis Date   Barrett's esophagus 12/15/2015   CHB (complete heart block) (HCC)    GERD (gastroesophageal reflux disease)    Hypertension     Past Surgical History:  Procedure Laterality Date   COLONOSCOPY     LIVER BIOPSY     Several Years ago per patient   PACEMAKER IMPLANT N/A 12/05/2020   Procedure: PACEMAKER IMPLANT;  Surgeon: Evans Lance, MD;  Location: [facility];  Service: Cardiovascular;  Laterality: N/A;   UPPER GASTROINTESTINAL ENDOSCOPY      Home Medications:  Allergies as of 04/04/2021   No Known Allergies      Medication List        Accurate as of April 02, 2021  7:13 PM. If you have any questions, ask your nurse or doctor.          acetaminophen 325 MG tablet Commonly known as: TYLENOL Take 2 tablets (650 mg total) by mouth  every 6 (six) hours as needed for mild pain (or Fever >/= 101).   aspirin EC 81 MG tablet Take 1 tablet (81 mg total) by mouth daily. Swallow whole.   atorvastatin 10 MG tablet Commonly known as: Lipitor Take 1 tablet (10 mg total) by mouth daily.   b complex vitamins capsule Take 1 capsule by mouth daily.   hydrALAZINE 25 MG tablet Commonly known as: APRESOLINE Take 1 tablet (25 mg total) by mouth every 8 (eight) hours.   metoprolol succinate 25 MG 24 hr tablet Commonly known as: TOPROL-XL Take 1 tablet (25 mg total) by mouth daily.   olmesartan 40 MG tablet Commonly known as: BENICAR Take 1 tablet (40 mg total) by mouth daily.   omeprazole 20 MG capsule Commonly known as: PRILOSEC Take 20 mg by mouth daily.   PUMPKIN SEED PO Take 1 capsule by mouth daily.   SAW PALMETTO PO Take 1 capsule by mouth daily.        Allergies: No Known Allergies  Family History  Problem Relation Age of Onset   Alzheimer's disease Sister    Healthy Sister    Heart disease Sister    Healthy Brother    Heart disease Brother    Kidney disease Brother    Heart disease Brother    Healthy Daughter     Social History:  reports that he has never smoked. He has never used smokeless tobacco. He reports that he does not drink alcohol and does not use drugs.  ROS: A complete  review of systems was performed.  All systems are negative except for pertinent findings as noted.  Physical Exam:  Vital signs in last 24 hours: There were no vitals taken for this visit. Constitutional:  Alert and oriented, No acute distress Cardiovascular: Regular rate  Respiratory: Normal respiratory effort Psychiatric: Normal mood and affect  I have reviewed prior pt notes  I have reviewed notes from referring/previous physicians  I have reviewed urinalysis results  I have independently reviewed prior imaging--CT and bone scan images reviewed  I have reviewed prior PSA/pathology  results     Impression/Assessment:  Unfavorable intermediate risk prostate cancer, new diagnosis and 88 year old male.  No evidence of extraprostatic disease on CT/bone scans  Nomogram predictions- Organ confined disease-20% Lymph node/seminal vesicle invasion-20/22% respectively 5/10-year progression free probability after prostatectomy-47/32%  Plan:  The patient (and family member present) was/were counseled about the natural history of prostate cancer and the standard treatment options that are available for prostate cancer. It was explained to him how his age and life expectancy, clinical stage, Gleason score, and PSA affect his prognosis, the decision to proceed with additional staging studies, as well as how that information influences recommended treatment strategies. We discussed the roles for active surveillance, radiation therapy, surgical therapy, androgen deprivation, as well as ablative therapy options for the treatment of prostate cancer as appropriate to his individual cancer situation. We discussed the risks and benefits of these options with regard to their impact on cancer control and also in terms of potential adverse events, complications, and impact on quality of life particularly related to urinary, bowel, and sexual function. The patient was encouraged to ask questions throughout the discussion today and all questions were answered to his stated satisfaction. In addition, the patient was provided with and/or directed to appropriate resources and literature for further education about prostate cancer and treatment options.  I did strongly encourage him to seek consultation with radiation oncologist to look into external beam radiotherapy.  The patient does live in [place], and I think it is worthwhile to see somebody in [place].  I will put that order in.  45 minutes spent in preparation for the visit, reviewing pathology, other laboratories, imaging as well as  face-to-face time with the patient.

## 2021-04-04 ENCOUNTER — Encounter (INDEPENDENT_AMBULATORY_CARE_PROVIDER_SITE_OTHER): Payer: Self-pay | Admitting: Internal Medicine

## 2021-04-04 ENCOUNTER — Ambulatory Visit: Payer: Medicare HMO | Admitting: Urology

## 2021-04-04 ENCOUNTER — Other Ambulatory Visit: Payer: Self-pay

## 2021-04-04 ENCOUNTER — Encounter: Payer: Self-pay | Admitting: Urology

## 2021-04-04 DIAGNOSIS — C61 Malignant neoplasm of prostate: Secondary | ICD-10-CM

## 2021-04-18 ENCOUNTER — Telehealth: Payer: Self-pay | Admitting: Urology

## 2021-04-18 NOTE — Telephone Encounter (Signed)
FYI

## 2021-04-18 NOTE — Telephone Encounter (Signed)
[facility] appt.  04/19/21 @ 9:15 am with Dr. Venia Minks.

## 2021-05-11 ENCOUNTER — Encounter: Payer: Self-pay | Admitting: Internal Medicine

## 2021-06-06 ENCOUNTER — Ambulatory Visit (INDEPENDENT_AMBULATORY_CARE_PROVIDER_SITE_OTHER): Payer: Medicare HMO

## 2021-06-06 DIAGNOSIS — I442 Atrioventricular block, complete: Secondary | ICD-10-CM | POA: Diagnosis not present

## 2021-06-06 LAB — CUP PACEART REMOTE DEVICE CHECK
Battery Remaining Longevity: 104 mo
Battery Remaining Percentage: 95.5 %
Battery Voltage: 3.01 V
Brady Statistic AP VP Percent: 50 %
Brady Statistic AP VS Percent: 23 %
Brady Statistic AS VP Percent: 17 %
Brady Statistic AS VS Percent: 9.9 %
Brady Statistic RA Percent Paced: 72 %
Brady Statistic RV Percent Paced: 67 %
Date Time Interrogation Session: 20230321020014
Implantable Lead Implant Date: 20220919
Implantable Lead Implant Date: 20220919
Implantable Lead Location: 753859
Implantable Lead Location: 753860
Implantable Pulse Generator Implant Date: 20220919
Lead Channel Impedance Value: 480 Ohm
Lead Channel Impedance Value: 590 Ohm
Lead Channel Pacing Threshold Amplitude: 0.5 V
Lead Channel Pacing Threshold Amplitude: 0.75 V
Lead Channel Pacing Threshold Pulse Width: 0.5 ms
Lead Channel Pacing Threshold Pulse Width: 0.5 ms
Lead Channel Sensing Intrinsic Amplitude: 5 mV
Lead Channel Sensing Intrinsic Amplitude: 8.6 mV
Lead Channel Setting Pacing Amplitude: 2 V
Lead Channel Setting Pacing Amplitude: 2.5 V
Lead Channel Setting Pacing Pulse Width: 0.5 ms
Lead Channel Setting Sensing Sensitivity: 2 mV
Pulse Gen Model: 2272
Pulse Gen Serial Number: 3964109

## 2021-06-14 ENCOUNTER — Telehealth: Payer: Self-pay

## 2021-06-14 NOTE — Telephone Encounter (Signed)
Nurse Patsy from radiation oncology called letting us know that the patient will send a transmission tomorrow afternoon after his last treatment. Usually a rep is there to interrogate but since they are short staffed they are not able to make it. The nurse would like [place] to fax this transmission to her office after we receive it tomorrow to [phone]  so that Dr. Venia Minks can look at it. The nurse can be reached at [phone] if there are any questions.  ?

## 2021-06-15 NOTE — Telephone Encounter (Signed)
Transmission received, normal device function no episodes recorded. Report faxed as per request. ?

## 2021-06-20 NOTE — Progress Notes (Signed)
Remote pacemaker transmission.   

## 2021-07-17 ENCOUNTER — Other Ambulatory Visit: Payer: Self-pay | Admitting: Cardiovascular Disease

## 2021-09-05 ENCOUNTER — Ambulatory Visit (INDEPENDENT_AMBULATORY_CARE_PROVIDER_SITE_OTHER): Payer: Medicare HMO

## 2021-09-05 DIAGNOSIS — I442 Atrioventricular block, complete: Secondary | ICD-10-CM

## 2021-09-05 LAB — CUP PACEART REMOTE DEVICE CHECK
Battery Remaining Longevity: 106 mo
Battery Remaining Percentage: 95 %
Battery Voltage: 2.99 V
Brady Statistic AP VP Percent: 39 %
Brady Statistic AP VS Percent: 34 %
Brady Statistic AS VP Percent: 15 %
Brady Statistic AS VS Percent: 12 %
Brady Statistic RA Percent Paced: 73 %
Brady Statistic RV Percent Paced: 54 %
Date Time Interrogation Session: 20230620020020
Implantable Lead Implant Date: 20220919
Implantable Lead Implant Date: 20220919
Implantable Lead Location: 753859
Implantable Lead Location: 753860
Implantable Pulse Generator Implant Date: 20220919
Lead Channel Impedance Value: 530 Ohm
Lead Channel Impedance Value: 630 Ohm
Lead Channel Pacing Threshold Amplitude: 0.5 V
Lead Channel Pacing Threshold Amplitude: 0.75 V
Lead Channel Pacing Threshold Pulse Width: 0.5 ms
Lead Channel Pacing Threshold Pulse Width: 0.5 ms
Lead Channel Sensing Intrinsic Amplitude: 5 mV
Lead Channel Sensing Intrinsic Amplitude: 7.3 mV
Lead Channel Setting Pacing Amplitude: 2 V
Lead Channel Setting Pacing Amplitude: 2.5 V
Lead Channel Setting Pacing Pulse Width: 0.5 ms
Lead Channel Setting Sensing Sensitivity: 2 mV
Pulse Gen Model: 2272
Pulse Gen Serial Number: 3964109

## 2021-09-20 NOTE — Progress Notes (Signed)
Remote pacemaker transmission.   

## 2021-09-28 ENCOUNTER — Other Ambulatory Visit: Payer: Self-pay | Admitting: *Deleted

## 2021-09-28 DIAGNOSIS — I6529 Occlusion and stenosis of unspecified carotid artery: Secondary | ICD-10-CM

## 2021-10-17 ENCOUNTER — Ambulatory Visit: Payer: Medicare HMO | Admitting: Urology

## 2021-10-18 ENCOUNTER — Encounter: Payer: Self-pay | Admitting: Vascular Surgery

## 2021-10-18 ENCOUNTER — Ambulatory Visit (INDEPENDENT_AMBULATORY_CARE_PROVIDER_SITE_OTHER): Payer: Medicare HMO

## 2021-10-18 ENCOUNTER — Ambulatory Visit: Payer: Medicare HMO | Admitting: Vascular Surgery

## 2021-10-18 VITALS — BP 170/90 | HR 73 | Temp 99.5°F | Resp 16 | Ht 68.0 in | Wt 199.8 lb

## 2021-10-18 DIAGNOSIS — I6529 Occlusion and stenosis of unspecified carotid artery: Secondary | ICD-10-CM | POA: Diagnosis not present

## 2021-10-18 NOTE — Progress Notes (Signed)
[facility] [place]  Patient name: Brett Bryant MRN: [id number] DOB: 18-Jan-1934 Sex: male  REASON FOR CONSULT: Evaluation of asymptomatic carotid stenosis  HPI: Brett Bryant is a 88 y.o. male, who is here today for discussion of asymptomatic carotid disease.  He is here today with his daughter.  He underwent work-up of "blackout spells" approximately 1 year ago.  Work-up included carotid duplex which showed moderate right carotid stenosis.  He specifically denies any prior focal neurologic deficits.  No history of amaurosis fugax, aphasia, TIA or stroke.  He had multiple trips to the emergency department and eventually was discovered bradycardic episodes and had a pacemaker placed.  He has had no further blackout spells since this time.  He is here today for follow-up duplex.  He has no history of coronary artery disease.  He is right-handed  Past Medical History:  Diagnosis Date   Barrett's esophagus 12/15/2015   CHB (complete heart block) (HCC)    GERD (gastroesophageal reflux disease)    Hypertension     Family History  Problem Relation Age of Onset   Alzheimer's disease Sister    Healthy Sister    Heart disease Sister    Healthy Brother    Heart disease Brother    Kidney disease Brother    Heart disease Brother    Healthy Daughter     SOCIAL HISTORY: Social History   Socioeconomic History   Marital status: Widowed    Spouse name: Not on file   Number of children: 1   Years of education: Not on file   Highest education level: Not on file  Occupational History   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  Tobacco Use   Smoking status: Never    Passive exposure: Never   Smokeless tobacco: Never  Vaping Use   Vaping Use: Never used  Substance and Sexual Activity   Alcohol use: No   Drug use: No   Sexual activity: Not Currently  Other Topics Concern   Not on file  Social History Narrative   Not on file    Social Determinants of Health   Financial Resource Strain: Not on file  Food Insecurity: Not on file  Transportation Needs: Not on file  Physical Activity: Not on file  Stress: Not on file  Social Connections: Not on file  Intimate Partner Violence: Not on file    No Known Allergies  Current Outpatient Medications  Medication Sig Dispense Refill   acetaminophen (TYLENOL) 325 MG tablet Take 2 tablets (650 mg total) by mouth every 6 (six) hours as needed for mild pain (or Fever >/= 101).     aspirin EC 81 MG tablet Take 1 tablet (81 mg total) by mouth daily. Swallow whole. 90 tablet 3   atorvastatin (LIPITOR) 10 MG tablet Take 1 tablet (10 mg total) by mouth daily. 90 tablet 3   b complex vitamins capsule Take 1 capsule by mouth daily.     olmesartan (BENICAR) 40 MG tablet TAKE 1 TABLET BY MOUTH EVERY DAY 90 tablet 3   omeprazole (PRILOSEC) 20 MG capsule Take 20 mg by mouth daily.     PUMPKIN SEED PO Take 1 capsule by mouth daily.     Saw Palmetto, Serenoa repens, (SAW PALMETTO PO) Take 1 capsule by mouth daily.     hydrALAZINE (APRESOLINE) 25 MG tablet Take 1 tablet (25 mg total) by mouth every 8 (eight) hours. 270 tablet 3   metoprolol succinate (TOPROL-XL) 25 MG 24 hr  tablet Take 1 tablet (25 mg total) by mouth daily. 90 tablet 3   No current facility-administered medications for this visit.    REVIEW OF SYSTEMS:  '[X]'$  denotes positive finding, '[ ]'$  denotes negative finding Cardiac  Comments:  Chest pain or chest pressure:    Shortness of breath upon exertion:    Short of breath when lying flat:    Irregular heart rhythm: x       Vascular    Pain in calf, thigh, or hip brought on by ambulation:    Pain in feet at night that wakes you up from your sleep:     Blood clot in your veins:    Leg swelling:         Pulmonary    Oxygen at home:    Productive cough:     Wheezing:         Neurologic    Sudden weakness in arms or legs:     Sudden numbness in arms or legs:      Sudden onset of difficulty speaking or slurred speech:    Temporary loss of vision in one eye:     Problems with dizziness:         Gastrointestinal    Blood in stool:     Vomited blood:         Genitourinary    Burning when urinating:     Blood in urine:        Psychiatric    Major depression:         Hematologic    Bleeding problems:    Problems with blood clotting too easily:        Skin    Rashes or ulcers:        Constitutional    Fever or chills:      PHYSICAL EXAM: Vitals:   10/18/21 1217 10/18/21 1219  BP: (!) 190/74 (!) 170/90  Pulse: 73   Resp: 16   Temp: 99.5 F (37.5 C)   TempSrc: Temporal   SpO2: 98%   Weight: 199 lb 12.8 oz (90.6 kg)   Height: '5\' 8"'$  (1.727 m)     GENERAL: The patient is a well-nourished male, in no acute distress. The vital signs are documented above. CARDIOVASCULAR: Carotid arteries without bruits bilaterally.  2+ radial pulses bilaterally. PULMONARY: There is good air exchange  MUSCULOSKELETAL: There are no major deformities or cyanosis. NEUROLOGIC: No focal weakness or paresthesias are detected. SKIN: There are no ulcers or rashes noted. PSYCHIATRIC: The patient has a normal affect.  DATA:  Repeat duplex today in our office was discussed with the patient and his daughter.  This shows no significant stenosis in his left internal carotid artery.  He does have mild to moderate disease in the right internal carotid.  Her velocity criteria are somewhat less than that obtained at [facility] in 9/22.  He does have somewhat elevated systolic velocities but no elevation in diastolic velocities.  This does put him in the upper range of the 1 to 39% stenosis.  MEDICAL ISSUES: I discussed this at length with the patient.  Did review symptoms of right brain events and he knows to present immediately to the emergency room should this occur.  Otherwise we will see him in 1 year with repeat carotid duplex   Rosetta Posner, MD  [facility] [phone] Pager [phone]  Note: Portions of this report may have been  transcribed using voice recognition software.  Every effort has been made to ensure accuracy; however, inadvertent computerized transcription errors may still be present.

## 2021-10-31 ENCOUNTER — Ambulatory Visit (INDEPENDENT_AMBULATORY_CARE_PROVIDER_SITE_OTHER): Payer: Medicare HMO | Admitting: Urology

## 2021-10-31 ENCOUNTER — Encounter: Payer: Self-pay | Admitting: Urology

## 2021-10-31 VITALS — BP 189/76 | HR 60 | Ht 68.0 in | Wt 200.0 lb

## 2021-10-31 DIAGNOSIS — C61 Malignant neoplasm of prostate: Secondary | ICD-10-CM | POA: Diagnosis not present

## 2021-10-31 NOTE — Progress Notes (Signed)
History of Present Illness: Here for follow-up of unfavorable intermediate risk prostate cancer.  Underwent TRUS/Bx 11.8.2022. Prostate volume 41.4 mL, PSA 13, PSAD 0.31. Bx results as follows:    12.8.2022: Bone scan IMPRESSION: Intense uptake within the T6 and T8 vertebral bodies corresponding to fractures noted on prior CT examination. Correlation with clinical examination for point tenderness is recommended. These would be better assessed with MRI examination, particularly if vertebroplasty is to be considered for vertebral stabilization.   No osseous metastatic disease.   12.13.2022: CT A/P;  IMPRESSION: 1. No acute findings identified within the abdomen or pelvis. No mass or adenopathy to suggest metastatic disease. 2. Small exophytic lesion arising off the lateral cortex of the upper pole of left kidney measures 8 mm and is technically too small to characterize. Attention on follow-up imaging advised. 3. Inferior pole right kidney cyst. 4. Gallstones. 5. Supraumbilical ventral abdominal wall hernia contains fat only. 6. Aortic Atherosclerosis (ICD10-I70.0).   1.17.2023: Discussion regarding his prostate cancer.  I made a referral for him to see a radiation oncologist in [place].  8.15.2023: Here for follow-up.  He completed 28 fractions of EBRT in late March, 2023.  He tolerated this well.  Most recent PSA checked less than 2 months ago was down from 13 pretreatment to 2.9.  He has not had any lower urinary tract symptoms of note.  IPSS 3, quality-of-life score 2.  He has not had blood in urine or stool.  Past Medical History:  Diagnosis Date   Barrett's esophagus 12/15/2015   CHB (complete heart block) (HCC)    GERD (gastroesophageal reflux disease)    Hypertension     Past Surgical History:  Procedure Laterality Date   COLONOSCOPY     LIVER BIOPSY     Several Years ago per patient   PACEMAKER IMPLANT N/A 12/05/2020   Procedure: PACEMAKER  IMPLANT;  Surgeon: Evans Lance, MD;  Location: [facility];  Service: Cardiovascular;  Laterality: N/A;   UPPER GASTROINTESTINAL ENDOSCOPY      Home Medications:  Allergies as of 10/31/2021   No Known Allergies      Medication List        Accurate as of October 31, 2021  8:20 AM. If you have any questions, ask your nurse or doctor.          acetaminophen 325 MG tablet Commonly known as: TYLENOL Take 2 tablets (650 mg total) by mouth every 6 (six) hours as needed for mild pain (or Fever >/= 101).   aspirin EC 81 MG tablet Take 1 tablet (81 mg total) by mouth daily. Swallow whole.   atorvastatin 10 MG tablet Commonly known as: Lipitor Take 1 tablet (10 mg total) by mouth daily.   b complex vitamins capsule Take 1 capsule by mouth daily.   hydrALAZINE 25 MG tablet Commonly known as: APRESOLINE Take 1 tablet (25 mg total) by mouth every 8 (eight) hours.   metoprolol succinate 25 MG 24 hr tablet Commonly known as: TOPROL-XL Take 1 tablet (25 mg total) by mouth daily.   olmesartan 40 MG tablet Commonly known as: BENICAR TAKE 1 TABLET BY MOUTH EVERY DAY   omeprazole 20 MG capsule Commonly known as: PRILOSEC Take 20 mg by mouth daily.   PUMPKIN SEED PO Take 1 capsule by mouth daily.   SAW PALMETTO PO Take 1 capsule by mouth daily.        Allergies: No Known Allergies  Family History  Problem Relation  Age of Onset   Alzheimer's disease Sister    Healthy Sister    Heart disease Sister    Healthy Brother    Heart disease Brother    Kidney disease Brother    Heart disease Brother    Healthy Daughter     Social History:  reports that he has never smoked. He has never been exposed to tobacco smoke. He has never used smokeless tobacco. He reports that he does not drink alcohol and does not use drugs.  ROS: A complete review of systems was performed.  All systems are negative except for pertinent findings as noted.  Physical Exam:  Vital signs  in last 24 hours: There were no vitals taken for this visit. Constitutional:  Alert and oriented, No acute distress Cardiovascular: Regular rate  Respiratory: Normal respiratory effort Neurologic: Grossly intact, no focal deficits Psychiatric: Normal mood and affect  I have reviewed prior pt notes  I have reviewed urinalysis results--clear  I have independently reviewed prior imaging  I have reviewed prior PSA results     Impression/Assessment:  Unfavorable intermediate risk prostate cancer, diagnosed in November 2022 EBRT in March of this year.  Excellent first PSA, no significant radiation related sequelae  Plan:  1.  I advised the patient that he should be checked on a regular basis with PSA and urinalysis.  He did see Dr. Venia Minks following his procedure and has another appointment months.  I will be happy to see him long-term, but I did suggest that he consider just following up with Dr. Venia Minks  2.  He will return to see me on an as-needed basis

## 2021-11-02 LAB — URINALYSIS, ROUTINE W REFLEX MICROSCOPIC
Bilirubin, UA: NEGATIVE
Ketones, UA: NEGATIVE
Leukocytes,UA: NEGATIVE
Nitrite, UA: NEGATIVE
Protein,UA: NEGATIVE
RBC, UA: NEGATIVE
Specific Gravity, UA: 1.015 (ref 1.005–1.030)
Urobilinogen, Ur: 0.2 mg/dL (ref 0.2–1.0)
pH, UA: 7 (ref 5.0–7.5)

## 2021-12-05 ENCOUNTER — Ambulatory Visit (INDEPENDENT_AMBULATORY_CARE_PROVIDER_SITE_OTHER): Payer: Medicare HMO

## 2021-12-05 DIAGNOSIS — I442 Atrioventricular block, complete: Secondary | ICD-10-CM | POA: Diagnosis not present

## 2021-12-05 LAB — CUP PACEART REMOTE DEVICE CHECK
Battery Remaining Longevity: 101 mo
Battery Remaining Percentage: 92 %
Battery Voltage: 2.99 V
Brady Statistic AP VP Percent: 53 %
Brady Statistic AP VS Percent: 22 %
Brady Statistic AS VP Percent: 17 %
Brady Statistic AS VS Percent: 7.5 %
Brady Statistic RA Percent Paced: 75 %
Brady Statistic RV Percent Paced: 70 %
Date Time Interrogation Session: 20230919020016
Implantable Lead Implant Date: 20220919
Implantable Lead Implant Date: 20220919
Implantable Lead Location: 753859
Implantable Lead Location: 753860
Implantable Pulse Generator Implant Date: 20220919
Lead Channel Impedance Value: 530 Ohm
Lead Channel Impedance Value: 610 Ohm
Lead Channel Pacing Threshold Amplitude: 0.5 V
Lead Channel Pacing Threshold Amplitude: 0.75 V
Lead Channel Pacing Threshold Pulse Width: 0.5 ms
Lead Channel Pacing Threshold Pulse Width: 0.5 ms
Lead Channel Sensing Intrinsic Amplitude: 2.3 mV
Lead Channel Sensing Intrinsic Amplitude: 7.3 mV
Lead Channel Setting Pacing Amplitude: 2 V
Lead Channel Setting Pacing Amplitude: 2.5 V
Lead Channel Setting Pacing Pulse Width: 0.5 ms
Lead Channel Setting Sensing Sensitivity: 2 mV
Pulse Gen Model: 2272
Pulse Gen Serial Number: 3964109

## 2021-12-07 ENCOUNTER — Encounter: Payer: Self-pay | Admitting: Internal Medicine

## 2021-12-14 NOTE — Progress Notes (Signed)
Remote pacemaker transmission.   

## 2021-12-22 ENCOUNTER — Other Ambulatory Visit: Payer: Self-pay | Admitting: Cardiovascular Disease

## 2021-12-31 NOTE — Progress Notes (Unsigned)
Cardiology Office Note:   Date:  01/01/2022  NAME:  Brett Bryant    MRN: [id number] DOB:  1933/07/18   PCP:  Glenda Chroman, MD  Cardiologist:  None  Electrophysiologist:  None   Referring MD: Glenda Chroman, MD   Chief Complaint  Patient presents with   Follow-up        History of Present Illness:   Brett Bryant is a 88 y.o. male with a hx of SSS s/p ppm, aortic aneurysm, carotid artery disease who presents for follow-up. Repeat carotid studies unremarkable.  He presents with his daughter.  Reports he is doing well.  Denies any chest pain or trouble breathing.  Pacemaker seems to be working well.  Blood pressure slightly elevated.  He is doing home blood pressure monitoring with his primary care physician.  Blood pressure values are well controlled.  Apparently his values are always elevated at the doctor's office.  I reviewed his ultrasound which does show evidence of thoracic aortic dilation.  At his age suspect this is within limits.  There was discrepancy regarding his carotid artery stenosis severity.  Recent study showed minimal disease.  Examination unremarkable.  He is doing well.  Problem List SSS/CHB -s/p St. Jude dual chamber ppm 2. HTN 3. Thoracic aortic dilation -42 mm 11/25/2020 4. Carotid artery disease -1-39%   Past Medical History: Past Medical History:  Diagnosis Date   Barrett's esophagus 12/15/2015   CHB (complete heart block) (HCC)    GERD (gastroesophageal reflux disease)    Hypertension     Past Surgical History: Past Surgical History:  Procedure Laterality Date   COLONOSCOPY     LIVER BIOPSY     Several Years ago per patient   PACEMAKER IMPLANT N/A 12/05/2020   Procedure: PACEMAKER IMPLANT;  Surgeon: Evans Lance, MD;  Location: [facility];  Service: Cardiovascular;  Laterality: N/A;   UPPER GASTROINTESTINAL ENDOSCOPY      Current Medications: Current Meds  Medication Sig   acetaminophen (TYLENOL) 325 MG tablet Take 2 tablets (650 mg  total) by mouth every 6 (six) hours as needed for mild pain (or Fever >/= 101).   aspirin EC 81 MG tablet Take 1 tablet (81 mg total) by mouth daily. Swallow whole.   atorvastatin (LIPITOR) 10 MG tablet TAKE 1 TABLET BY MOUTH EVERY DAY   b complex vitamins capsule Take 1 capsule by mouth daily.   hydrALAZINE (APRESOLINE) 25 MG tablet Take 1 tablet (25 mg total) by mouth every 8 (eight) hours.   metoprolol succinate (TOPROL-XL) 25 MG 24 hr tablet Take 1 tablet (25 mg total) by mouth daily.   olmesartan (BENICAR) 40 MG tablet TAKE 1 TABLET BY MOUTH EVERY DAY   omeprazole (PRILOSEC) 20 MG capsule Take 20 mg by mouth daily.   PUMPKIN SEED PO Take 1 capsule by mouth daily.   Saw Palmetto, Serenoa repens, (SAW PALMETTO PO) Take 1 capsule by mouth daily.     Allergies:    Patient has no known allergies.   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  Tobacco Use   Smoking status: Never    Passive exposure: Never   Smokeless tobacco: Never  Vaping Use   Vaping Use: Never used  Substance and Sexual Activity   Alcohol use: No   Drug use: No   Sexual activity: Not Currently  Other Topics Concern   Not on file  Social History Narrative   Not on file   Social Determinants of Health   Financial Resource Strain: Not on file  Food Insecurity: Not on file  Transportation Needs: Not on file  Physical Activity: Not on file  Stress: Not on file  Social Connections: Not on file     Family History: The patient's family history includes Alzheimer's disease in his sister; Healthy in his brother, daughter, and sister; Heart disease in his brother, brother, and sister; Kidney disease in his brother.  ROS:   All other ROS reviewed and negative. Pertinent positives  noted in the HPI.     EKGs/Labs/Other Studies Reviewed:   The following studies were personally reviewed by me today:  Recent Labs: 01/10/2021: Hemoglobin 12.7; Platelets 165 02/23/2021: ALT 24; BUN 18; Creatinine, Ser 1.14; Potassium 5.6; Sodium 137   Recent Lipid Panel    Component Value Date/Time   CHOL 212 (H) 12/16/2014 1041   TRIG 143 12/16/2014 1041   HDL 49 12/16/2014 1041   CHOLHDL 4.3 12/16/2014 1041   VLDL 29 12/16/2014 1041   LDLCALC 134 (H) 12/16/2014 1041    Physical Exam:   VS:  BP (!) 158/70   Pulse 64   Ht '5\' 8"'$  (1.727 m)   Wt 206 lb (93.4 kg)   SpO2 96%   BMI 31.32 kg/m    Wt Readings from Last 3 Encounters:  01/01/22 206 lb (93.4 kg)  10/31/21 200 lb (90.7 kg)  10/18/21 199 lb 12.8 oz (90.6 kg)    General: Well nourished, well developed, in no acute distress Head: Atraumatic, normal size  Eyes: PEERLA, EOMI  Neck: Supple, no JVD Endocrine: No thryomegaly Cardiac: Normal S1, S2; RRR; no murmurs, rubs, or gallops Lungs: Clear to auscultation bilaterally, no wheezing, rhonchi or rales  Abd: Soft, nontender, no hepatomegaly  Ext: Trace edema Musculoskeletal: No deformities, BUE and BLE strength normal and equal Skin: Warm and dry, no rashes   Neuro: Alert and oriented to person, place, time, and situation, CNII-XII grossly intact, no focal deficits  Psych: Normal mood and affect   ASSESSMENT:   Brett Bryant is a 88 y.o. male who presents for the following: 1. CHB (complete heart block) ([place])   2. Syncope and collapse   3. Primary hypertension   4. Stenosis of right carotid artery   5. Ascending aorta dilation (HCC)     PLAN:   1. CHB (complete heart block) ([place]) 2. Syncope and collapse -Pacemaker working well.  No further issues.  Continue monitoring with Dr. Lovena Le.  3. Primary hypertension -Well-controlled at home.  Values are elevated here.  Suspect he has an element of whitecoat hypertension.  No change in medications.  4. Stenosis of  right carotid artery -Recent ultrasound shows no significant carotid artery stenosis.  We will continue with medical management for now.  Currently on aspirin as well as statin therapy.  5. Ascending aorta dilation (HCC) -42 mm last year.  Suspect this is approaching upper limits of normal for his age.  We will repeat an echocardiogram when I see him back in 1 year.  Disposition: Return in about 1 year (around 01/02/2023).  Medication Adjustments/Labs and Tests Ordered: Current medicines are reviewed at length with the patient today.  Concerns regarding medicines are outlined above.  Orders Placed This Encounter  Procedures   ECHOCARDIOGRAM COMPLETE   No orders of the defined types were placed in this encounter.   Patient Instructions  Medication Instructions:  Your physician recommends that you continue on your current medications as directed. Please refer to the Current Medication list given to you today.   Labwork: None today  Testing/Procedures: Your physician has requested that you have an echocardiogram in 1 year 2024. Echocardiography is a painless test that uses sound waves to create images of your heart. It provides your doctor with information about the size and shape of your heart and how well your heart's chambers and valves are working. This procedure takes approximately one hour. There are no restrictions for this procedure. Please do NOT wear cologne, perfume, aftershave, or lotions (deodorant is allowed). Please arrive 15 minutes prior to your appointment time.   Follow-Up: 1 year  Any Other Special Instructions Will Be Listed Below (If Applicable).  If you need a refill on your cardiac medications before your next appointment, please call your pharmacy.    Time Spent with Patient: I have spent a total of 25 minutes with patient reviewing hospital notes, telemetry, EKGs, labs and examining the patient as well as establishing an assessment and plan that was  discussed with the patient.  > 50% of time was spent in direct patient care.  Signed, Addison Naegeli. Audie Box, MD, [facility]  [address] [place], [place] [address] [phone]  01/01/2022 1:29 PM

## 2022-01-01 ENCOUNTER — Ambulatory Visit: Payer: Medicare HMO | Attending: Cardiovascular Disease | Admitting: Cardiovascular Disease

## 2022-01-01 ENCOUNTER — Encounter: Payer: Self-pay | Admitting: Cardiovascular Disease

## 2022-01-01 VITALS — BP 158/70 | HR 64 | Ht 68.0 in | Wt 206.0 lb

## 2022-01-01 DIAGNOSIS — R55 Syncope and collapse: Secondary | ICD-10-CM | POA: Diagnosis not present

## 2022-01-01 DIAGNOSIS — I10 Essential (primary) hypertension: Secondary | ICD-10-CM | POA: Diagnosis not present

## 2022-01-01 DIAGNOSIS — I442 Atrioventricular block, complete: Secondary | ICD-10-CM

## 2022-01-01 DIAGNOSIS — I6521 Occlusion and stenosis of right carotid artery: Secondary | ICD-10-CM | POA: Diagnosis not present

## 2022-01-01 DIAGNOSIS — I77810 Thoracic aortic ectasia: Secondary | ICD-10-CM

## 2022-01-01 NOTE — Patient Instructions (Signed)
Medication Instructions:  Your physician recommends that you continue on your current medications as directed. Please refer to the Current Medication list given to you today.   Labwork: None today  Testing/Procedures: Your physician has requested that you have an echocardiogram in 1 year 2024. Echocardiography is a painless test that uses sound waves to create images of your heart. It provides your doctor with information about the size and shape of your heart and how well your heart's chambers and valves are working. This procedure takes approximately one hour. There are no restrictions for this procedure. Please do NOT wear cologne, perfume, aftershave, or lotions (deodorant is allowed). Please arrive 15 minutes prior to your appointment time.   Follow-Up: 1 year  Any Other Special Instructions Will Be Listed Below (If Applicable).  If you need a refill on your cardiac medications before your next appointment, please call your pharmacy.

## 2022-01-27 ENCOUNTER — Encounter (INDEPENDENT_AMBULATORY_CARE_PROVIDER_SITE_OTHER): Payer: Self-pay | Admitting: Gastroenterology

## 2022-01-29 ENCOUNTER — Other Ambulatory Visit: Payer: Self-pay | Admitting: Cardiovascular Disease

## 2022-01-31 ENCOUNTER — Other Ambulatory Visit: Payer: Self-pay | Admitting: Cardiovascular Disease

## 2022-03-06 ENCOUNTER — Ambulatory Visit (INDEPENDENT_AMBULATORY_CARE_PROVIDER_SITE_OTHER): Payer: Medicare HMO

## 2022-03-06 DIAGNOSIS — I442 Atrioventricular block, complete: Secondary | ICD-10-CM

## 2022-03-06 LAB — CUP PACEART REMOTE DEVICE CHECK
Battery Remaining Longevity: 96 mo
Battery Remaining Percentage: 90 %
Battery Voltage: 2.99 V
Brady Statistic AP VP Percent: 58 %
Brady Statistic AP VS Percent: 16 %
Brady Statistic AS VP Percent: 20 %
Brady Statistic AS VS Percent: 5.6 %
Brady Statistic RA Percent Paced: 74 %
Brady Statistic RV Percent Paced: 78 %
Date Time Interrogation Session: 20231219020017
Implantable Lead Connection Status: 753985
Implantable Lead Connection Status: 753985
Implantable Lead Implant Date: 20220919
Implantable Lead Implant Date: 20220919
Implantable Lead Location: 753859
Implantable Lead Location: 753860
Implantable Pulse Generator Implant Date: 20220919
Lead Channel Impedance Value: 480 Ohm
Lead Channel Impedance Value: 630 Ohm
Lead Channel Pacing Threshold Amplitude: 0.5 V
Lead Channel Pacing Threshold Amplitude: 0.75 V
Lead Channel Pacing Threshold Pulse Width: 0.5 ms
Lead Channel Pacing Threshold Pulse Width: 0.5 ms
Lead Channel Sensing Intrinsic Amplitude: 12 mV
Lead Channel Sensing Intrinsic Amplitude: 5 mV
Lead Channel Setting Pacing Amplitude: 2 V
Lead Channel Setting Pacing Amplitude: 2.5 V
Lead Channel Setting Pacing Pulse Width: 0.5 ms
Lead Channel Setting Sensing Sensitivity: 2 mV
Pulse Gen Model: 2272
Pulse Gen Serial Number: 3964109

## 2022-04-02 NOTE — Progress Notes (Signed)
Remote pacemaker transmission.   

## 2022-04-04 ENCOUNTER — Encounter: Payer: Self-pay | Admitting: Medical

## 2022-04-04 ENCOUNTER — Encounter: Payer: Self-pay | Admitting: Internal Medicine

## 2022-04-04 ENCOUNTER — Ambulatory Visit: Payer: Medicare HMO | Attending: Internal Medicine | Admitting: Internal Medicine

## 2022-04-04 VITALS — BP 146/60 | HR 65 | Ht 68.0 in | Wt 212.0 lb

## 2022-04-04 DIAGNOSIS — I10 Essential (primary) hypertension: Secondary | ICD-10-CM | POA: Diagnosis not present

## 2022-04-04 DIAGNOSIS — I4430 Unspecified atrioventricular block: Secondary | ICD-10-CM

## 2022-04-04 LAB — CUP PACEART INCLINIC DEVICE CHECK
Battery Remaining Longevity: 93 mo
Battery Voltage: 2.99 V
Brady Statistic RA Percent Paced: 74 %
Brady Statistic RV Percent Paced: 80 %
Date Time Interrogation Session: 20240117120553
Implantable Lead Connection Status: 753985
Implantable Lead Connection Status: 753985
Implantable Lead Implant Date: 20220919
Implantable Lead Implant Date: 20220919
Implantable Lead Location: 753859
Implantable Lead Location: 753860
Implantable Pulse Generator Implant Date: 20220919
Lead Channel Impedance Value: 462.5 Ohm
Lead Channel Impedance Value: 612.5 Ohm
Lead Channel Pacing Threshold Amplitude: 0.5 V
Lead Channel Pacing Threshold Amplitude: 0.5 V
Lead Channel Pacing Threshold Amplitude: 0.75 V
Lead Channel Pacing Threshold Amplitude: 0.75 V
Lead Channel Pacing Threshold Pulse Width: 0.5 ms
Lead Channel Pacing Threshold Pulse Width: 0.5 ms
Lead Channel Pacing Threshold Pulse Width: 0.5 ms
Lead Channel Pacing Threshold Pulse Width: 0.5 ms
Lead Channel Sensing Intrinsic Amplitude: 12 mV
Lead Channel Sensing Intrinsic Amplitude: 5 mV
Lead Channel Setting Pacing Amplitude: 2 V
Lead Channel Setting Pacing Amplitude: 2.5 V
Lead Channel Setting Pacing Pulse Width: 0.5 ms
Lead Channel Setting Sensing Sensitivity: 2 mV
Pulse Gen Model: 2272
Pulse Gen Serial Number: 3964109

## 2022-04-04 NOTE — Patient Instructions (Signed)
Medication Instructions:  Your physician recommends that you continue on your current medications as directed. Please refer to the Current Medication list given to you today.  *If you need a refill on your cardiac medications before your next appointment, please call your pharmacy*   Lab Work: NONE   If you have labs (blood work) drawn today and your tests are completely normal, you will receive your results only by: MyChart Message (if you have MyChart) OR A paper copy in the mail If you have any lab test that is abnormal or we need to change your treatment, we will call you to review the results.   Testing/Procedures: NONE    Follow-Up: At [facility], you and your health needs are our priority.  As part of our continuing mission to provide you with exceptional heart care, we have created designated Provider Care Teams.  These Care Teams include your primary Cardiologist (physician) and Advanced Practice Providers (APPs -  Physician Assistants and Nurse Practitioners) who all work together to provide you with the care you need, when you need it.  We recommend signing up for the patient portal called "MyChart".  Sign up information is provided on this After Visit Summary.  MyChart is used to connect with patients for Virtual Visits (Telemedicine).  Patients are able to view lab/test results, encounter notes, upcoming appointments, etc.  Non-urgent messages can be sent to your provider as well.   To learn more about what you can do with MyChart, go to [email or web address].    Your next appointment:   1 year(s)  Provider:   Gregg Taylor, MD    Other Instructions Thank you for choosing [facility]!    

## 2022-04-04 NOTE — Progress Notes (Signed)
HPI Mr. Mergenthaler returns today for followup. He is a pleasant 89 yo man with a h/o Stokes Adams syncope and intermittent CHB who underwent PPM insertion 3 months ago. He has done well in the interim with no limitations to physical activity. He denies chest pain or sob. No edema.  No Known Allergies   Current Outpatient Medications  Medication Sig Dispense Refill   acetaminophen (TYLENOL) 325 MG tablet Take 2 tablets (650 mg total) by mouth every 6 (six) hours as needed for mild pain (or Fever >/= 101).     aspirin EC 81 MG tablet Take 1 tablet (81 mg total) by mouth daily. Swallow whole. 90 tablet 3   atorvastatin (LIPITOR) 10 MG tablet TAKE 1 TABLET BY MOUTH EVERY DAY 90 tablet 3   b complex vitamins capsule Take 1 capsule by mouth daily.     clotrimazole-betamethasone (LOTRISONE) cream Apply 1 Application topically daily at 6 (six) AM.     hydrALAZINE (APRESOLINE) 25 MG tablet TAKE 1 TABLET BY MOUTH EVERY 8 HOURS. 270 tablet 3   metoprolol succinate (TOPROL-XL) 25 MG 24 hr tablet Take 1 tablet (25 mg total) by mouth daily. 90 tablet 3   olmesartan (BENICAR) 40 MG tablet TAKE 1 TABLET BY MOUTH EVERY DAY 90 tablet 3   omeprazole (PRILOSEC) 20 MG capsule Take 20 mg by mouth daily.     PUMPKIN SEED PO Take 1 capsule by mouth daily.     Saw Palmetto, Serenoa repens, (SAW PALMETTO PO) Take 1 capsule by mouth daily.     No current facility-administered medications for this visit.     Past Medical History:  Diagnosis Date   Barrett's esophagus 12/15/2015   CHB (complete heart block) (HCC)    GERD (gastroesophageal reflux disease)    Hypertension     ROS:   All systems reviewed and negative except as noted in the HPI.   Past Surgical History:  Procedure Laterality Date   COLONOSCOPY     LIVER BIOPSY     Several Years ago per patient   PACEMAKER IMPLANT N/A 12/05/2020   Procedure: PACEMAKER IMPLANT;  Surgeon: Evans Lance, MD;  Location: [facility];  Service:  Cardiovascular;  Laterality: N/A;   UPPER GASTROINTESTINAL ENDOSCOPY       Family History  Problem Relation Age of Onset   Alzheimer's disease Sister    Healthy Sister    Heart disease Sister    Healthy Brother    Heart disease Brother    Kidney disease Brother    Heart disease Brother    Healthy Daughter      Social History   Socioeconomic History   Marital status: Widowed    Spouse name: Not on file   Number of children: 1   Years of education: Not on file   Highest education level: Not on file  Occupational History   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  Tobacco Use   Smoking status: Never    Passive exposure: Never   Smokeless tobacco: Never  Vaping Use   Vaping Use: Never used  Substance and Sexual Activity   Alcohol use: No   Drug use: No   Sexual activity: Not Currently  Other Topics Concern   Not on file  Social History Narrative   Not on file   Social Determinants of Health   Financial Resource Strain: Not on file  Food Insecurity: Not on file  Transportation Needs: Not on file  Physical Activity:  Not on file  Stress: Not on file  Social Connections: Not on file  Intimate Partner Violence: Not on file     There were no vitals taken for this visit.  Physical Exam:  Well appearing NAD HEENT: Unremarkable Neck:  No JVD, no thyromegally Lymphatics:  No adenopathy Back:  No CVA tenderness Lungs:  Clear HEART:  Regular rate rhythm, no murmurs, no rubs, no clicks Abd:  soft, positive bowel sounds, no organomegally, no rebound, no guarding Ext:  2 plus pulses, no edema, no cyanosis, no clubbing Skin:  No rashes no nodules Neuro:  CN II through XII intact, motor grossly intact  EKG  DEVICE  Normal device function.  See PaceArt for details.   Assess/Plan:  CHB - he is device dependent today. He is asymptomatic s/p PPM insertion. I have tightened up his AV delays. Syncope - this has resolved since his PPM insertion. PPM -his St. Jude DDD  PM is working normally and we will recheck in several months. HTN - he will continue toprol.   Carleene Overlie Damien Cisar,MD

## 2022-05-31 ENCOUNTER — Encounter: Payer: Self-pay | Admitting: Internal Medicine

## 2022-06-05 ENCOUNTER — Ambulatory Visit (INDEPENDENT_AMBULATORY_CARE_PROVIDER_SITE_OTHER): Payer: Medicare HMO

## 2022-06-05 DIAGNOSIS — I442 Atrioventricular block, complete: Secondary | ICD-10-CM

## 2022-06-06 LAB — CUP PACEART REMOTE DEVICE CHECK
Battery Remaining Longevity: 91 mo
Battery Remaining Percentage: 87 %
Battery Voltage: 2.99 V
Brady Statistic AP VP Percent: 69 %
Brady Statistic AP VS Percent: 1 %
Brady Statistic AS VP Percent: 31 %
Brady Statistic AS VS Percent: 1 %
Brady Statistic RA Percent Paced: 67 %
Brady Statistic RV Percent Paced: 99 %
Date Time Interrogation Session: 20240319020012
Implantable Lead Connection Status: 753985
Implantable Lead Connection Status: 753985
Implantable Lead Implant Date: 20220919
Implantable Lead Implant Date: 20220919
Implantable Lead Location: 753859
Implantable Lead Location: 753860
Implantable Pulse Generator Implant Date: 20220919
Lead Channel Impedance Value: 440 Ohm
Lead Channel Impedance Value: 630 Ohm
Lead Channel Pacing Threshold Amplitude: 0.5 V
Lead Channel Pacing Threshold Amplitude: 0.75 V
Lead Channel Pacing Threshold Pulse Width: 0.5 ms
Lead Channel Pacing Threshold Pulse Width: 0.5 ms
Lead Channel Sensing Intrinsic Amplitude: 12 mV
Lead Channel Sensing Intrinsic Amplitude: 5 mV
Lead Channel Setting Pacing Amplitude: 2 V
Lead Channel Setting Pacing Amplitude: 2.5 V
Lead Channel Setting Pacing Pulse Width: 0.5 ms
Lead Channel Setting Sensing Sensitivity: 2 mV
Pulse Gen Model: 2272
Pulse Gen Serial Number: 3964109

## 2022-07-16 NOTE — Progress Notes (Signed)
Remote pacemaker transmission.   

## 2022-07-18 ENCOUNTER — Other Ambulatory Visit: Payer: Self-pay | Admitting: Cardiovascular Disease

## 2022-09-04 ENCOUNTER — Ambulatory Visit: Payer: Medicare HMO | Attending: Internal Medicine

## 2022-09-04 DIAGNOSIS — I442 Atrioventricular block, complete: Secondary | ICD-10-CM

## 2022-09-05 ENCOUNTER — Telehealth: Payer: Self-pay

## 2022-09-05 ENCOUNTER — Telehealth: Payer: Self-pay | Admitting: Internal Medicine

## 2022-09-05 DIAGNOSIS — I442 Atrioventricular block, complete: Secondary | ICD-10-CM

## 2022-09-05 DIAGNOSIS — I10 Essential (primary) hypertension: Secondary | ICD-10-CM

## 2022-09-05 LAB — CUP PACEART REMOTE DEVICE CHECK
Battery Remaining Longevity: 87 mo
Battery Remaining Percentage: 84 %
Battery Voltage: 2.99 V
Brady Statistic AP VP Percent: 67 %
Brady Statistic AP VS Percent: 1 %
Brady Statistic AS VP Percent: 29 %
Brady Statistic AS VS Percent: 1 %
Brady Statistic RA Percent Paced: 61 %
Brady Statistic RV Percent Paced: 95 %
Date Time Interrogation Session: 20240618020013
Implantable Lead Connection Status: 753985
Implantable Lead Connection Status: 753985
Implantable Lead Implant Date: 20220919
Implantable Lead Implant Date: 20220919
Implantable Lead Location: 753859
Implantable Lead Location: 753860
Implantable Pulse Generator Implant Date: 20220919
Lead Channel Impedance Value: 440 Ohm
Lead Channel Impedance Value: 590 Ohm
Lead Channel Pacing Threshold Amplitude: 0.5 V
Lead Channel Pacing Threshold Amplitude: 0.75 V
Lead Channel Pacing Threshold Pulse Width: 0.5 ms
Lead Channel Pacing Threshold Pulse Width: 0.5 ms
Lead Channel Sensing Intrinsic Amplitude: 12 mV
Lead Channel Sensing Intrinsic Amplitude: 5 mV
Lead Channel Setting Pacing Amplitude: 2 V
Lead Channel Setting Pacing Amplitude: 2.5 V
Lead Channel Setting Pacing Pulse Width: 0.5 ms
Lead Channel Setting Sensing Sensitivity: 2 mV
Pulse Gen Model: 2272
Pulse Gen Serial Number: 3964109

## 2022-09-05 NOTE — Telephone Encounter (Signed)
Pt to have labs drawn at Dr. Sherril Croon office. Orders faxed to [phone]

## 2022-09-05 NOTE — Telephone Encounter (Addendum)
Alert received from [employer]:  Scheduled remote reviewed. Normal device function.   5 AMS for AF with controlled rates, longest duration 20hrs , burden <1% No hx of PAF or OAC in EPIC , no alerts received - route to triage  Outreach made to Pt.  Spoke with daughter-caregiver.  Advised of afib found on recent transmission.  Appointment made with [facility] 09/11/22 to discuss.  Pt has PCP appt this week and family will request CBC/BMP at that visit.  Advised would return call if any further recommendations received from [facility] prior to scheduled appt.  All questions answered.

## 2022-09-05 NOTE — Telephone Encounter (Signed)
New Message:    Daughter says patient needs a lab order faxed to Dr Bonnita Levan office in [place] for tomorrow please. She said he needs a CBC and BMP.

## 2022-09-11 ENCOUNTER — Encounter: Payer: Self-pay | Admitting: Internal Medicine

## 2022-09-11 ENCOUNTER — Ambulatory Visit: Payer: Medicare HMO | Attending: Internal Medicine | Admitting: Internal Medicine

## 2022-09-11 VITALS — BP 138/80 | HR 73 | Ht 68.0 in | Wt 204.4 lb

## 2022-09-11 DIAGNOSIS — I442 Atrioventricular block, complete: Secondary | ICD-10-CM | POA: Diagnosis not present

## 2022-09-11 LAB — CUP PACEART INCLINIC DEVICE CHECK
Battery Remaining Longevity: 86 mo
Battery Voltage: 2.99 V
Brady Statistic RA Percent Paced: 61 %
Brady Statistic RV Percent Paced: 95 %
Date Time Interrogation Session: 20240625174041
Implantable Lead Connection Status: 753985
Implantable Lead Connection Status: 753985
Implantable Lead Implant Date: 20220919
Implantable Lead Implant Date: 20220919
Implantable Lead Location: 753859
Implantable Lead Location: 753860
Implantable Pulse Generator Implant Date: 20220919
Lead Channel Impedance Value: 462.5 Ohm
Lead Channel Impedance Value: 612.5 Ohm
Lead Channel Pacing Threshold Amplitude: 0.5 V
Lead Channel Pacing Threshold Amplitude: 0.5 V
Lead Channel Pacing Threshold Amplitude: 0.75 V
Lead Channel Pacing Threshold Amplitude: 0.75 V
Lead Channel Pacing Threshold Pulse Width: 0.5 ms
Lead Channel Pacing Threshold Pulse Width: 0.5 ms
Lead Channel Pacing Threshold Pulse Width: 0.5 ms
Lead Channel Pacing Threshold Pulse Width: 0.5 ms
Lead Channel Sensing Intrinsic Amplitude: 5 mV
Lead Channel Setting Pacing Amplitude: 2 V
Lead Channel Setting Pacing Amplitude: 2.5 V
Lead Channel Setting Pacing Pulse Width: 0.5 ms
Lead Channel Setting Sensing Sensitivity: 2 mV
Pulse Gen Model: 2272
Pulse Gen Serial Number: 3964109

## 2022-09-11 MED ORDER — APIXABAN 5 MG PO TABS: 5.0000 mg | ORAL_TABLET | Freq: Two times a day (BID) | ORAL | 11 refills | Status: DC

## 2022-09-11 NOTE — Patient Instructions (Addendum)
Medication Instructions:  START Eliquis 5 mg twice a day    STOP Aspirin Labwork: None today  Testing/Procedures: None today  Follow-Up: 1 year  Any Other Special Instructions Will Be Listed Below (If Applicable).  If you need a refill on your cardiac medications before your next appointment, please call your pharmacy.

## 2022-09-11 NOTE — Progress Notes (Signed)
HPI [name] returns today for followup. He is a pleasant 89 yo man with a h/o Stokes Adams syncope and intermittent CHB who underwent PPM insertion over a year ago. He has done well in the interim with no limitations to physical activity. He denies chest pain or sob. No edema.   No Known Allergies   Current Outpatient Medications  Medication Sig Dispense Refill   acetaminophen (TYLENOL) 325 MG tablet Take 2 tablets (650 mg total) by mouth every 6 (six) hours as needed for mild pain (or Fever >/= 101).     apixaban (ELIQUIS) 5 MG TABS tablet Take 1 tablet (5 mg total) by mouth 2 (two) times daily. 60 tablet 11   atorvastatin (LIPITOR) 10 MG tablet TAKE 1 TABLET BY MOUTH EVERY DAY 90 tablet 3   b complex vitamins capsule Take 1 capsule by mouth daily.     clotrimazole-betamethasone (LOTRISONE) cream Apply 1 Application topically daily at 6 (six) AM.     hydrALAZINE (APRESOLINE) 25 MG tablet TAKE 1 TABLET BY MOUTH EVERY 8 HOURS. 270 tablet 3   metoprolol succinate (TOPROL-XL) 25 MG 24 hr tablet Take 1 tablet (25 mg total) by mouth daily. 90 tablet 3   olmesartan (BENICAR) 40 MG tablet TAKE 1 TABLET BY MOUTH EVERY DAY 90 tablet 2   omeprazole (PRILOSEC) 20 MG capsule Take 20 mg by mouth daily.     PUMPKIN SEED PO Take 1 capsule by mouth daily.     Saw Palmetto, Serenoa repens, (SAW PALMETTO PO) Take 1 capsule by mouth daily.     No current facility-administered medications for this visit.     Past Medical History:  Diagnosis Date   Barrett's esophagus 12/15/2015   CHB (complete heart block) (HCC)    GERD (gastroesophageal reflux disease)    Hypertension     ROS:   All systems reviewed and negative except as noted in the HPI.   Past Surgical History:  Procedure Laterality Date   COLONOSCOPY     LIVER BIOPSY     Several Years ago per patient   PACEMAKER IMPLANT N/A 12/05/2020   Procedure: PACEMAKER IMPLANT;  Surgeon: Marinus Maw, MD;  Location: [facility];   Service: Cardiovascular;  Laterality: N/A;   UPPER GASTROINTESTINAL ENDOSCOPY       Family History  Problem Relation Age of Onset   Alzheimer's disease Sister    Healthy Sister    Heart disease Sister    Healthy Brother    Heart disease Brother    Kidney disease Brother    Heart disease Brother    Healthy Daughter      Social History   Socioeconomic History   Marital status: Widowed    Spouse name: Not on file   Number of children: 1   Years of education: Not on file   Highest education level: Not on file  Occupational History   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  Tobacco Use   Smoking status: Never    Passive exposure: Never   Smokeless tobacco: Never  Vaping Use   Vaping Use: Never used  Substance and Sexual Activity   Alcohol use: No   Drug use: No   Sexual activity: Not Currently  Other Topics Concern   Not on file  Social History Narrative   Not on file   Social Determinants of Health   Financial Resource Strain: Not on file  Food Insecurity: Not on file  Transportation Needs: Not on  file  Physical Activity: Not on file  Stress: Not on file  Social Connections: Not on file  Intimate Partner Violence: Not on file     BP 138/80   Pulse 73   Ht 5\' 8"  (1.727 m)   Wt 204 lb 6.4 oz (92.7 kg)   SpO2 97%   BMI 31.08 kg/m   Physical Exam:  Well appearing elderly man, NAD HEENT: Unremarkable Neck:  No JVD, no thyromegally Lymphatics:  No adenopathy Back:  No CVA tenderness Lungs:  Clear with no wheezes HEART:  Regular rate rhythm, no murmurs, no rubs, no clicks Abd:  soft, positive bowel sounds, no organomegally, no rebound, no guarding Ext:  2 plus pulses, no edema, no cyanosis, no clubbing Skin:  No rashes no nodules Neuro:  CN II through XII intact, motor grossly intact   DEVICE  Normal device function.  See PaceArt for details.   Assess/Plan:  CHB - he is device dependent today. He is asymptomatic s/p PPM insertion. I have  tightened up his AV delays. Syncope - this has resolved since his PPM insertion. PPM -his St. Jude DDD PM is working normally and we will recheck in several months. HTN - he will continue toprol.   Brett Gowda Abdalrahman Clementson,MD

## 2022-09-14 ENCOUNTER — Encounter: Payer: Self-pay | Admitting: Internal Medicine

## 2022-09-26 NOTE — Progress Notes (Signed)
Remote pacemaker transmission.   

## 2022-11-04 IMAGING — CT CT HEAD W/O CM
3 series · 15 of 47 positions shown, 18 images · non-contrast
Comparison: 06/27/2020.

CLINICAL DATA: Syncope.  Recent history of a subdural hemorrhage.

EXAM:
CT HEAD WITHOUT CONTRAST
TECHNIQUE: Contiguous axial images were obtained from the base of the skull
through the vertex without intravenous contrast.

[Series 2: head w o · axial · 0.45mm/px · z∈[+1383,+1508]mm · 9 of 30 slices shown, 12 images]
[im 3/30  brain]
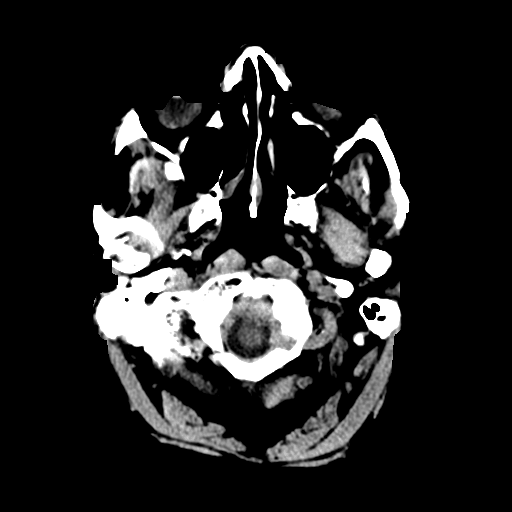
[im 3/30  bone]
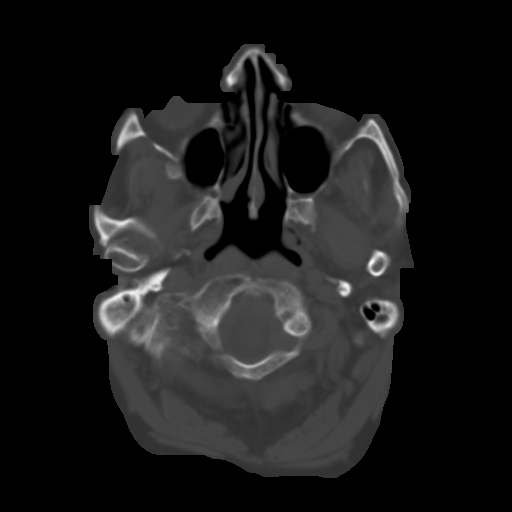
[im 6/30  brain]
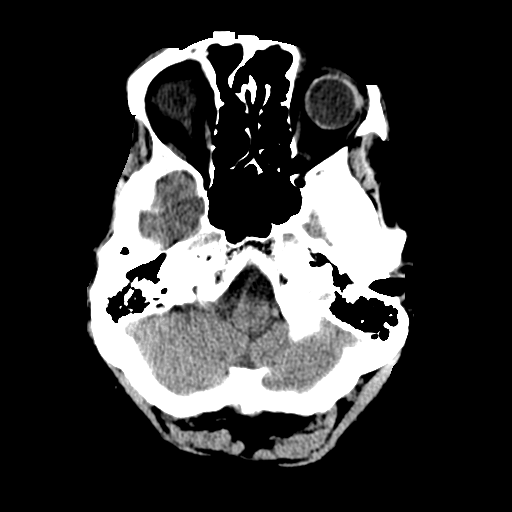
[im 9/30  brain]
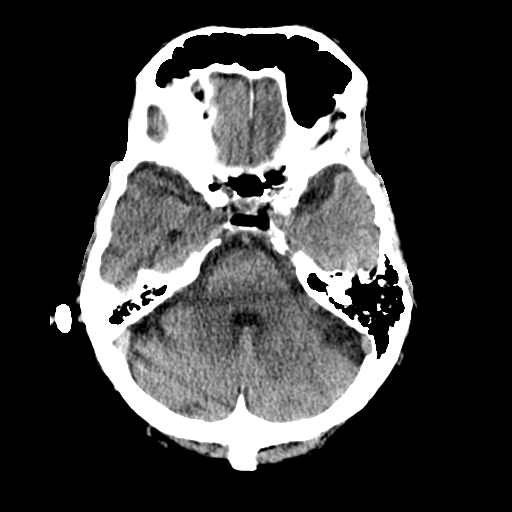
[im 12/30  brain]
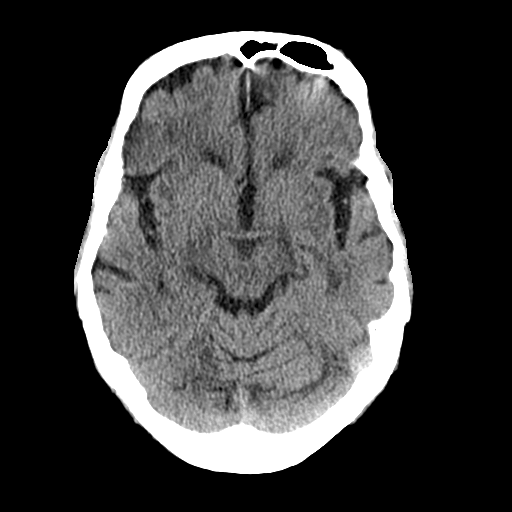
[im 16/30  brain]
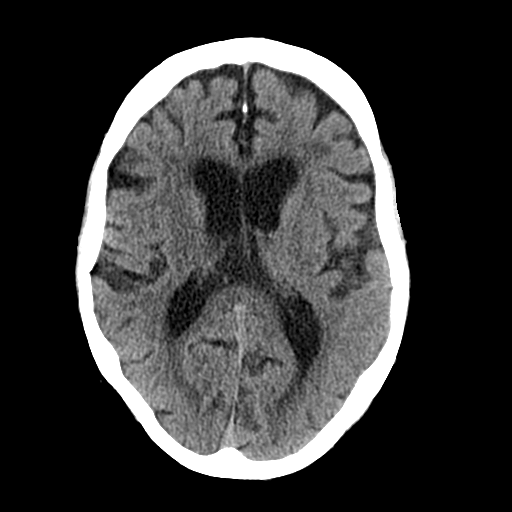
[im 16/30  bone]
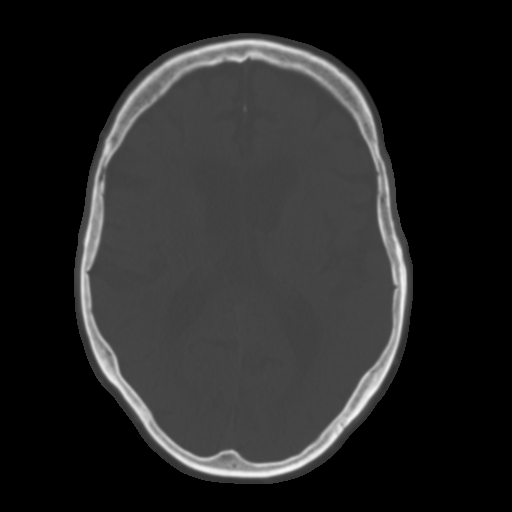
[im 19/30  brain]
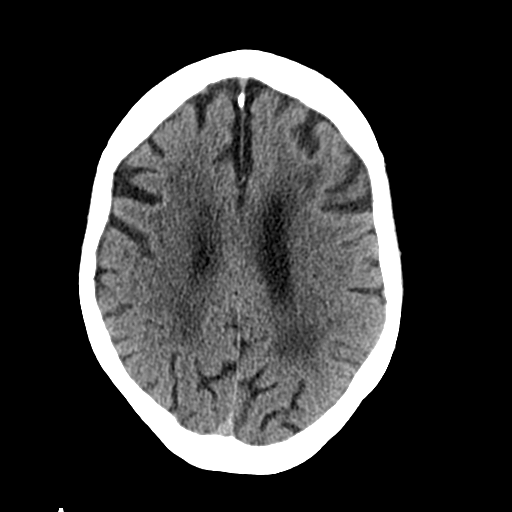
[im 22/30  brain]
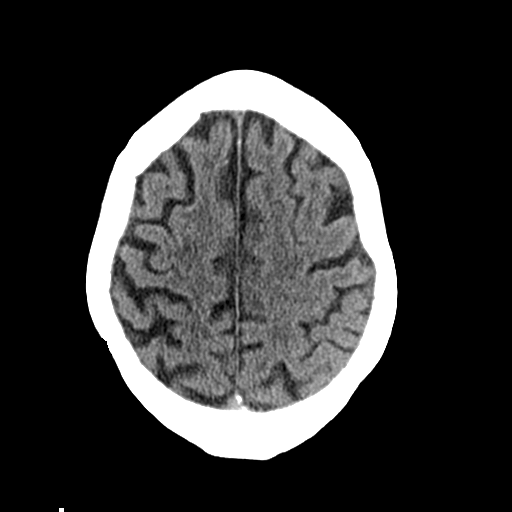
[im 25/30  brain]
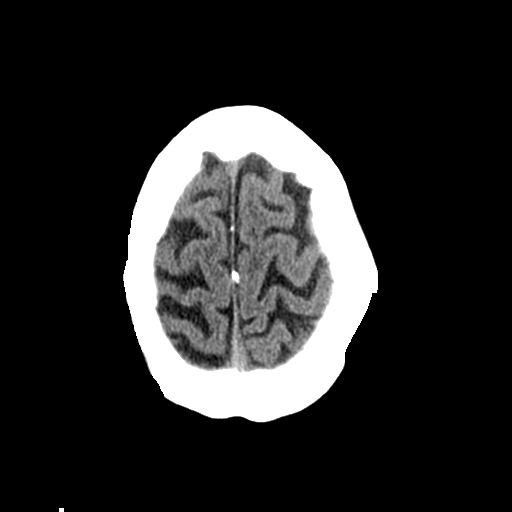
[im 28/30  brain]
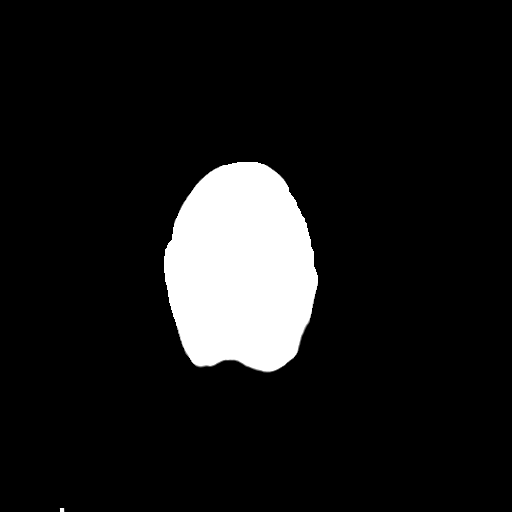
[im 28/30  bone]
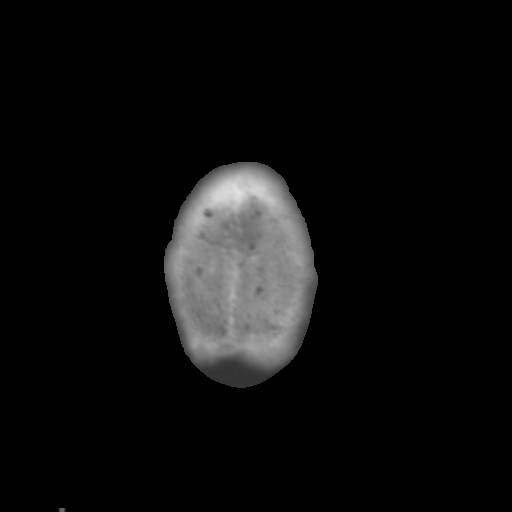

[Series 4: coronal soft · coronal · 0.33mm/px · 3 of 70 slices shown]
[im 24/70  brain]
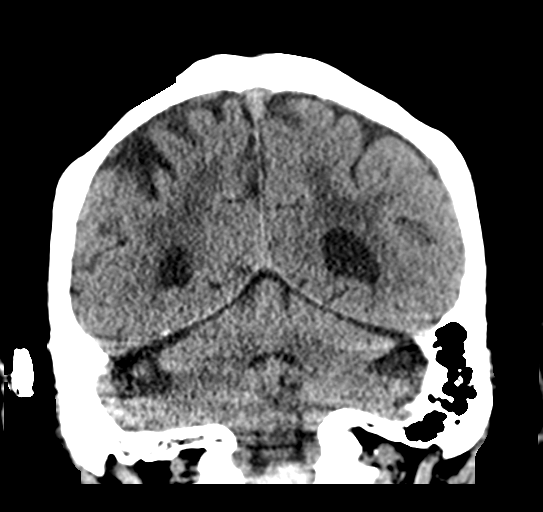
[im 31/70  brain]
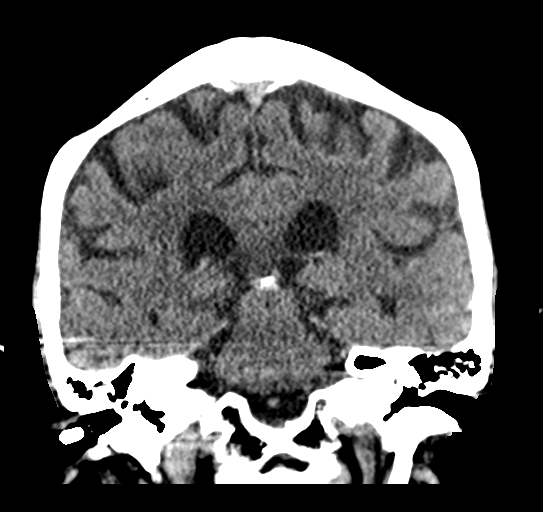
[im 39/70  brain]
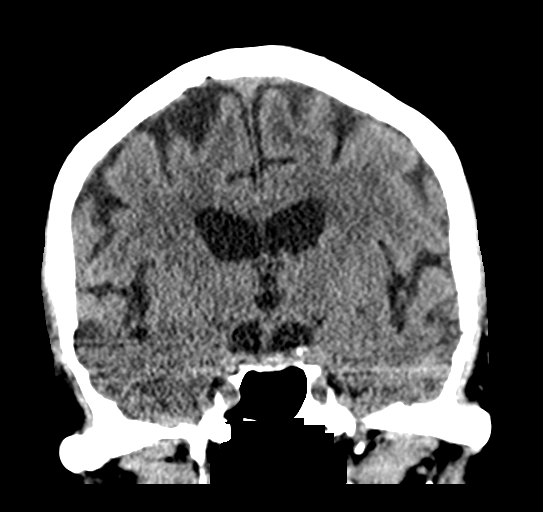

[Series 5: sagittal soft · sagittal · 0.31mm/px · 3 of 57 slices shown]
[im 19/57  brain]
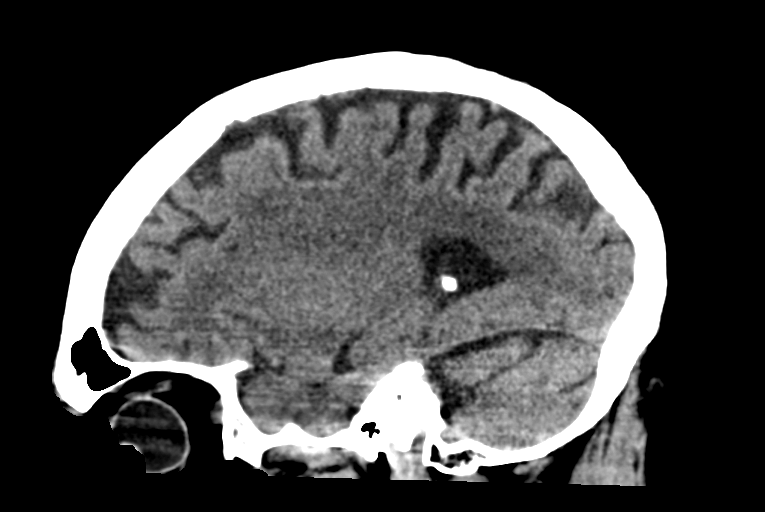
[im 29/57  brain]
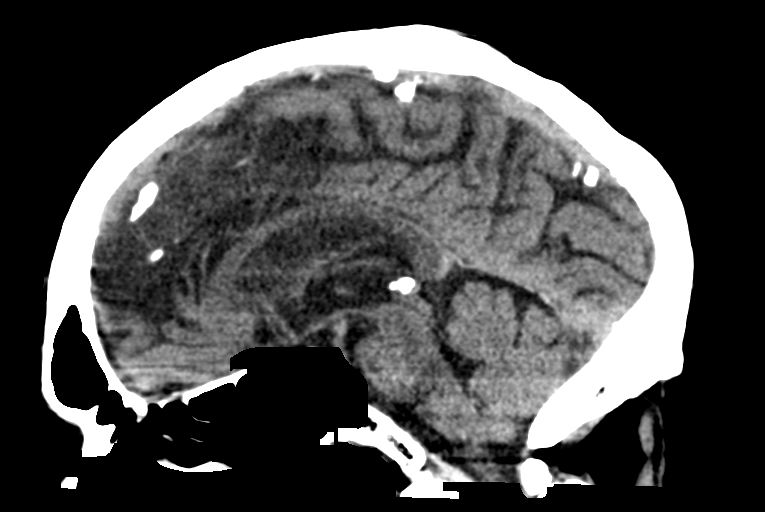
[im 38/57  brain]
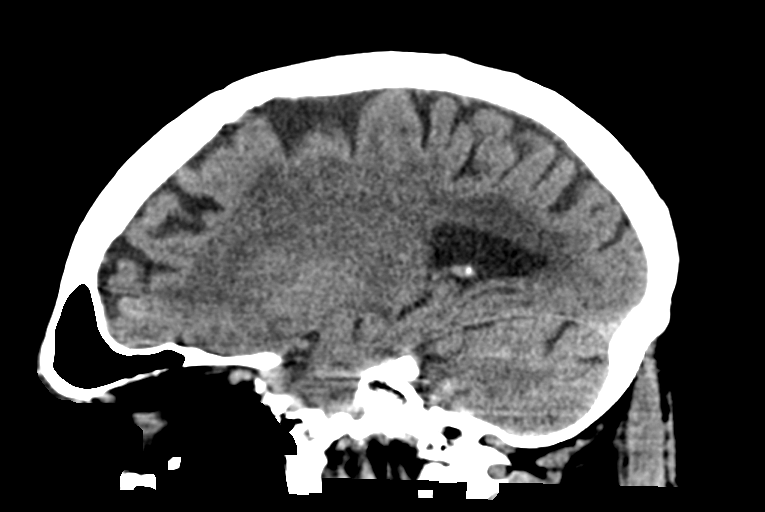

[15 of 47 positions shown; findings below may reference images not displayed]

FINDINGS: Brain: No evidence of acute infarction, hemorrhage, hydrocephalus,
extra-axial collection or mass lesion/mass effect.

No residual subdural hemorrhage/collection.

Stable ventricular and sulcal enlargement. Patchy areas of white
matter hypoattenuation are noted consistent with mild chronic
microvascular ischemic change, also stable.

Vascular: No hyperdense vessel or unexpected calcification.

Skull: Normal. Negative for fracture or focal lesion.

Sinuses/Orbits: Globes and orbits are unremarkable. Visualized
sinuses are clear.

Other: None.
IMPRESSION: 1. No acute intracranial abnormalities.
2. Stable volume loss and mild chronic microvascular ischemic
change.
3. No residual subdural collection.

## 2022-11-11 ENCOUNTER — Other Ambulatory Visit: Payer: Self-pay | Admitting: Cardiovascular Disease

## 2022-11-30 ENCOUNTER — Other Ambulatory Visit: Payer: Self-pay | Admitting: Cardiovascular Disease

## 2022-11-30 NOTE — Telephone Encounter (Signed)
I think this is a patient of Dr. Flora Lipps.

## 2022-12-04 ENCOUNTER — Ambulatory Visit (INDEPENDENT_AMBULATORY_CARE_PROVIDER_SITE_OTHER): Payer: Medicare HMO

## 2022-12-04 DIAGNOSIS — I442 Atrioventricular block, complete: Secondary | ICD-10-CM | POA: Diagnosis not present

## 2022-12-04 LAB — CUP PACEART REMOTE DEVICE CHECK
Battery Remaining Longevity: 85 mo
Battery Remaining Percentage: 82 %
Battery Voltage: 2.99 V
Brady Statistic AP VP Percent: 66 %
Brady Statistic AP VS Percent: 1 %
Brady Statistic AS VP Percent: 31 %
Brady Statistic AS VS Percent: 1 %
Brady Statistic RA Percent Paced: 61 %
Brady Statistic RV Percent Paced: 96 %
Date Time Interrogation Session: 20240917020015
Implantable Lead Connection Status: 753985
Implantable Lead Connection Status: 753985
Implantable Lead Implant Date: 20220919
Implantable Lead Implant Date: 20220919
Implantable Lead Location: 753859
Implantable Lead Location: 753860
Implantable Pulse Generator Implant Date: 20220919
Lead Channel Impedance Value: 440 Ohm
Lead Channel Impedance Value: 590 Ohm
Lead Channel Pacing Threshold Amplitude: 0.5 V
Lead Channel Pacing Threshold Amplitude: 0.75 V
Lead Channel Pacing Threshold Pulse Width: 0.5 ms
Lead Channel Pacing Threshold Pulse Width: 0.5 ms
Lead Channel Sensing Intrinsic Amplitude: 12 mV
Lead Channel Sensing Intrinsic Amplitude: 5 mV
Lead Channel Setting Pacing Amplitude: 2 V
Lead Channel Setting Pacing Amplitude: 2.5 V
Lead Channel Setting Pacing Pulse Width: 0.5 ms
Lead Channel Setting Sensing Sensitivity: 2 mV
Pulse Gen Model: 2272
Pulse Gen Serial Number: 3964109

## 2022-12-13 ENCOUNTER — Ambulatory Visit: Payer: Medicare HMO | Attending: Cardiovascular Disease

## 2022-12-13 DIAGNOSIS — R55 Syncope and collapse: Secondary | ICD-10-CM

## 2022-12-13 DIAGNOSIS — I442 Atrioventricular block, complete: Secondary | ICD-10-CM | POA: Diagnosis not present

## 2022-12-13 DIAGNOSIS — I77810 Thoracic aortic ectasia: Secondary | ICD-10-CM

## 2022-12-13 DIAGNOSIS — I6521 Occlusion and stenosis of right carotid artery: Secondary | ICD-10-CM

## 2022-12-13 DIAGNOSIS — I10 Essential (primary) hypertension: Secondary | ICD-10-CM

## 2022-12-13 LAB — ECHOCARDIOGRAM COMPLETE
AR max vel: 2.53 cm2
AV Area VTI: 2.28 cm2
AV Area mean vel: 2.32 cm2
AV Mean grad: 5 mmHg
AV Peak grad: 8.3 mmHg
Ao pk vel: 1.44 m/s
Area-P 1/2: 2.99 cm2
Calc EF: 58.8 %
MV VTI: 1.6 cm2
P 1/2 time: 447 msec
S' Lateral: 3 cm
Single Plane A2C EF: 62.2 %
Single Plane A4C EF: 61.7 %

## 2022-12-19 NOTE — Progress Notes (Signed)
Remote pacemaker transmission.   

## 2022-12-31 ENCOUNTER — Encounter: Payer: Self-pay | Admitting: Internal Medicine

## 2022-12-31 ENCOUNTER — Ambulatory Visit: Payer: Medicare HMO | Attending: Internal Medicine | Admitting: Internal Medicine

## 2022-12-31 VITALS — BP 148/82 | HR 57 | Ht 68.0 in | Wt 200.4 lb

## 2022-12-31 DIAGNOSIS — I442 Atrioventricular block, complete: Secondary | ICD-10-CM

## 2022-12-31 DIAGNOSIS — I480 Paroxysmal atrial fibrillation: Secondary | ICD-10-CM | POA: Diagnosis not present

## 2022-12-31 DIAGNOSIS — Z950 Presence of cardiac pacemaker: Secondary | ICD-10-CM

## 2022-12-31 DIAGNOSIS — I10 Essential (primary) hypertension: Secondary | ICD-10-CM

## 2022-12-31 NOTE — Patient Instructions (Signed)
Medication Instructions:  Your physician recommends that you continue on your current medications as directed. Please refer to the Current Medication list given to you today.   Labwork: None  Testing/Procedures: None  Follow-Up: Your physician recommends that you schedule a follow-up appointment in: 1 year. You will receive a reminder call in about 8 months reminding you to schedule your appointment. If you don't receive this call, please contact our office.   Any Other Special Instructions Will Be Listed Below (If Applicable).  If you need a refill on your cardiac medications before your next appointment, please call your pharmacy.

## 2022-12-31 NOTE — Progress Notes (Signed)
Cardiology Office Note  Date: 12/31/2022   ID: Brett Bryant, DOB Dec 17, 1933, MRN [id number]  PCP:  Ignatius Specking, MD  Cardiologist:  Marjo Bicker, MD Electrophysiologist:  None   History of Present Illness: Brett Bryant is a 89 y.o. male  Accompanied by daughter.  Patient lives by himself, independent, works on his yard.  Asymptomatic, no angina, DOE, orthopnea, PND, palpitations, syncope.  Has minimal leg swelling.  He does use compression socks at home.  His blood pressures at home ranged between 110 and 120 mmHg SBP and he does have an element of whitecoat hypertension in the clinic today.  Past Medical History:  Diagnosis Date   Barrett's esophagus 12/15/2015   CHB (complete heart block) (HCC)    GERD (gastroesophageal reflux disease)    Hypertension     Past Surgical History:  Procedure Laterality Date   COLONOSCOPY     LIVER BIOPSY     Several Years ago per patient   PACEMAKER IMPLANT N/A 12/05/2020   Procedure: PACEMAKER IMPLANT;  Surgeon: Marinus Maw, MD;  [facility];  Service: Cardiovascular;  Laterality: N/A;   UPPER GASTROINTESTINAL ENDOSCOPY      Current Outpatient Medications  Medication Sig Dispense Refill   acetaminophen (TYLENOL) 325 MG tablet Take 2 tablets (650 mg total) by mouth every 6 (six) hours as needed for mild pain (or Fever >/= 101).     apixaban (ELIQUIS) 5 MG TABS tablet Take 1 tablet (5 mg total) by mouth 2 (two) times daily. 60 tablet 11   atorvastatin (LIPITOR) 10 MG tablet TAKE 1 TABLET BY MOUTH EVERY DAY 90 tablet 3   b complex vitamins capsule Take 1 capsule by mouth daily.     Coenzyme Q10 (COQ10) 50 MG CAPS Take by mouth daily.     Flaxseed, Linseed, (FLAX SEED OIL) 1000 MG CAPS Take 1 capsule by mouth 2 (two) times daily.     hydrALAZINE (APRESOLINE) 25 MG tablet TAKE 1 TABLET BY MOUTH EVERY 8 HOURS 270 tablet 3   latanoprost (XALATAN) 0.005 % ophthalmic solution Place 1 drop into both eyes at bedtime.      metoprolol succinate (TOPROL-XL) 25 MG 24 hr tablet TAKE 1 TABLET (25 MG TOTAL) BY MOUTH DAILY. 90 tablet 3   olmesartan (BENICAR) 40 MG tablet TAKE 1 TABLET BY MOUTH EVERY DAY 90 tablet 2   omeprazole (PRILOSEC) 20 MG capsule Take 20 mg by mouth daily.     PUMPKIN SEED PO Take 1 capsule by mouth daily.     Saw Palmetto, Serenoa repens, (SAW PALMETTO PO) Take 1 capsule by mouth daily.     zinc gluconate 50 MG tablet Take 50 mg by mouth daily.     No current facility-administered medications for this visit.   Allergies:  Patient has no known allergies.   Social History: The patient  reports that he has never smoked. He has never been exposed to tobacco smoke. He has never used smokeless tobacco. He reports that he does not drink alcohol and does not use drugs.   Family History: The patient's family history includes Alzheimer's disease in his sister; Healthy in his brother, daughter, and sister; Heart disease in his brother, brother, and sister; Kidney disease in his brother.   ROS:  Please see the history of present illness. Otherwise, complete review of systems is positive for none  All other systems are reviewed and negative.   Physical Exam: VS:  BP (!) 148/82 (BP  Location: Right Arm, Cuff Size: Normal)   Pulse (!) 57   Ht 5\' 8"  (1.727 m)   Wt 200 lb 6.4 oz (90.9 kg)   SpO2 98%   BMI 30.47 kg/m , BMI Body mass index is 30.47 kg/m.  Wt Readings from Last 3 Encounters:  12/31/22 200 lb 6.4 oz (90.9 kg)  09/11/22 204 lb 6.4 oz (92.7 kg)  04/04/22 212 lb (96.2 kg)    General: Patient appears comfortable at rest. HEENT: Conjunctiva and lids normal, oropharynx clear with moist mucosa. Neck: Supple, no elevated JVP or carotid bruits, no thyromegaly. Lungs: Clear to auscultation, nonlabored breathing at rest. Cardiac: Regular rate and rhythm, no S3 or significant systolic murmur, no pericardial rub. Abdomen: Soft, nontender, no hepatomegaly, bowel sounds present, no guarding or  rebound. Extremities: No pitting edema, distal pulses 2+. Skin: Warm and dry. Musculoskeletal: No kyphosis. Neuropsychiatric: Alert and oriented x3, affect grossly appropriate.  Recent Labwork: No results found for requested labs within last 365 days.     Component Value Date/Time   CHOL 212 (H) 12/16/2014 1041   TRIG 143 12/16/2014 1041   HDL 49 12/16/2014 1041   CHOLHDL 4.3 12/16/2014 1041   VLDL 29 12/16/2014 1041   LDLCALC 134 (H) 12/16/2014 1041     Assessment and Plan:  Ascending aorta dilatation: 42 mm in 2022 and 41 mm in 2024.  Can repeat echocardiogram in 2026.  Goal BP less than 130/80 mmHg.  Home BPs range around 110 to 120 mmHg SBP.  CHB s/p Saint Jude PPM: Asymptomatic, follows with EP.  Paroxysmal atrial fibrillation: Pacemaker device interrogation recently in 08/2022 showed 5 AMS with controlled rates, longest duration 20 hours 24 minutes, A-fib burden less than 1%.  EP started him on Eliquis 5 mg twice daily, no bleeding complications.  HTN, controlled: He does have whitecoat HTN, Home BPs range around 110 to 120 mmHg SBP.  Continue current antihypertensives, metoprolol succinate 25 mg once daily, hydralazine 25 mg every 8 hours and olmesartan 40 mg once daily.  Follows with PCP.  Carotid artery stenosis 1-39%: Not on aspirin due to Eliquis use.    I spent a total duration of 30 minutes reviewing the prior notes, imaging studies including echo, CT angio PE from 2022, face-to-face counseling discussing his medical conditions and management, and documenting the findings in the note.      Medication Adjustments/Labs and Tests Ordered: Current medicines are reviewed at length with the patient today.  Concerns regarding medicines are outlined above.    Disposition:  Follow up  1 year  Signed Sonam Huelsmann Verne Spurr, MD, 12/31/2022 12:07 PM    [facility] at [facility] [address] [place], [place], [place] [address]

## 2023-03-05 ENCOUNTER — Ambulatory Visit: Payer: Medicare HMO

## 2023-03-05 DIAGNOSIS — I442 Atrioventricular block, complete: Secondary | ICD-10-CM | POA: Diagnosis not present

## 2023-03-06 LAB — CUP PACEART REMOTE DEVICE CHECK
Battery Remaining Longevity: 81 mo
Battery Remaining Percentage: 79 %
Battery Voltage: 2.99 V
Brady Statistic AP VP Percent: 70 %
Brady Statistic AP VS Percent: 1 %
Brady Statistic AS VP Percent: 28 %
Brady Statistic AS VS Percent: 1 %
Brady Statistic RA Percent Paced: 67 %
Brady Statistic RV Percent Paced: 98 %
Date Time Interrogation Session: 20241217020013
Implantable Lead Connection Status: 753985
Implantable Lead Connection Status: 753985
Implantable Lead Implant Date: 20220919
Implantable Lead Implant Date: 20220919
Implantable Lead Location: 753859
Implantable Lead Location: 753860
Implantable Pulse Generator Implant Date: 20220919
Lead Channel Impedance Value: 430 Ohm
Lead Channel Impedance Value: 590 Ohm
Lead Channel Pacing Threshold Amplitude: 0.5 V
Lead Channel Pacing Threshold Amplitude: 0.75 V
Lead Channel Pacing Threshold Pulse Width: 0.5 ms
Lead Channel Pacing Threshold Pulse Width: 0.5 ms
Lead Channel Sensing Intrinsic Amplitude: 12 mV
Lead Channel Sensing Intrinsic Amplitude: 5 mV
Lead Channel Setting Pacing Amplitude: 2 V
Lead Channel Setting Pacing Amplitude: 2.5 V
Lead Channel Setting Pacing Pulse Width: 0.5 ms
Lead Channel Setting Sensing Sensitivity: 2 mV
Pulse Gen Model: 2272
Pulse Gen Serial Number: 3964109

## 2023-03-21 ENCOUNTER — Telehealth: Payer: Self-pay

## 2023-03-21 NOTE — Telephone Encounter (Signed)
 Patient stopped by office inquiring about forms. Called to let him know once provider signs then I will fax them for him. If any additional information is needed will reach out to him. Patient verbalized understanding

## 2023-04-03 ENCOUNTER — Telehealth: Payer: Self-pay

## 2023-04-03 NOTE — Telephone Encounter (Signed)
 Spoke with patient regarding his forms. Advised him that Dr. Mallipeddi has signed the provider section and he needed to bring us  additional information that was missing. Proof of income and denial letter, Patient stated that he would be coming by pick up forms and will send in the information. I highlighted the sections that included the information he need. Patient verbalized understanding.

## 2023-04-05 ENCOUNTER — Other Ambulatory Visit: Payer: Self-pay | Admitting: Cardiovascular Disease

## 2023-04-11 NOTE — Addendum Note (Signed)
Addended by: Geralyn Flash D on: 04/11/2023 12:30 PM   Modules accepted: Orders

## 2023-04-11 NOTE — Progress Notes (Signed)
Remote pacemaker transmission.   

## 2023-04-17 NOTE — Telephone Encounter (Signed)
Spoke with Marisue Humble at [facility]-Provided missing information requested

## 2023-04-17 NOTE — Telephone Encounter (Signed)
[place] [employer] assistance [employer] called in she states they received application but they need Dr. Jenene Slicker state license number. Please advise. She stated it is okay to give to them verbally

## 2023-04-25 ENCOUNTER — Encounter: Payer: Self-pay | Admitting: *Deleted

## 2023-04-25 NOTE — Progress Notes (Signed)
 Fax notification received from [employer] that once 3% prescription out of pocket expense is met for 2025 and proof submitted, he will be able to receive eliquis  free of charge. Total 3% was $1005.00. Spent $152.89. Need to spend $852.11. Patient informed and verbalized understanding.

## 2023-06-04 ENCOUNTER — Ambulatory Visit (INDEPENDENT_AMBULATORY_CARE_PROVIDER_SITE_OTHER): Payer: Medicare HMO

## 2023-06-04 DIAGNOSIS — I442 Atrioventricular block, complete: Secondary | ICD-10-CM

## 2023-06-05 LAB — CUP PACEART REMOTE DEVICE CHECK
Battery Remaining Longevity: 80 mo
Battery Remaining Percentage: 76 %
Battery Voltage: 2.98 V
Brady Statistic AP VP Percent: 73 %
Brady Statistic AP VS Percent: 1 %
Brady Statistic AS VP Percent: 26 %
Brady Statistic AS VS Percent: 1 %
Brady Statistic RA Percent Paced: 71 %
Brady Statistic RV Percent Paced: 99 %
Date Time Interrogation Session: 20250318020016
Implantable Lead Connection Status: 753985
Implantable Lead Connection Status: 753985
Implantable Lead Implant Date: 20220919
Implantable Lead Implant Date: 20220919
Implantable Lead Location: 753859
Implantable Lead Location: 753860
Implantable Pulse Generator Implant Date: 20220919
Lead Channel Impedance Value: 460 Ohm
Lead Channel Impedance Value: 640 Ohm
Lead Channel Pacing Threshold Amplitude: 0.5 V
Lead Channel Pacing Threshold Amplitude: 0.75 V
Lead Channel Pacing Threshold Pulse Width: 0.5 ms
Lead Channel Pacing Threshold Pulse Width: 0.5 ms
Lead Channel Sensing Intrinsic Amplitude: 12 mV
Lead Channel Sensing Intrinsic Amplitude: 5 mV
Lead Channel Setting Pacing Amplitude: 2 V
Lead Channel Setting Pacing Amplitude: 2.5 V
Lead Channel Setting Pacing Pulse Width: 0.5 ms
Lead Channel Setting Sensing Sensitivity: 2 mV
Pulse Gen Model: 2272
Pulse Gen Serial Number: 3964109

## 2023-07-19 NOTE — Progress Notes (Signed)
 Remote pacemaker transmission.

## 2023-07-19 NOTE — Addendum Note (Signed)
 Addended by: Lott Rouleau A on: 07/19/2023 02:21 PM   Modules accepted: Orders

## 2023-09-03 ENCOUNTER — Ambulatory Visit (INDEPENDENT_AMBULATORY_CARE_PROVIDER_SITE_OTHER): Payer: Medicare HMO

## 2023-09-03 ENCOUNTER — Other Ambulatory Visit: Payer: Self-pay | Admitting: Internal Medicine

## 2023-09-03 DIAGNOSIS — I442 Atrioventricular block, complete: Secondary | ICD-10-CM | POA: Diagnosis not present

## 2023-09-03 LAB — CUP PACEART REMOTE DEVICE CHECK
Battery Remaining Longevity: 77 mo
Battery Remaining Percentage: 74 %
Battery Voltage: 2.98 V
Brady Statistic AP VP Percent: 73 %
Brady Statistic AP VS Percent: 1 %
Brady Statistic AS VP Percent: 26 %
Brady Statistic AS VS Percent: 1 %
Brady Statistic RA Percent Paced: 71 %
Brady Statistic RV Percent Paced: 99 %
Date Time Interrogation Session: 20250617020015
Implantable Lead Connection Status: 753985
Implantable Lead Connection Status: 753985
Implantable Lead Implant Date: 20220919
Implantable Lead Implant Date: 20220919
Implantable Lead Location: 753859
Implantable Lead Location: 753860
Implantable Pulse Generator Implant Date: 20220919
Lead Channel Impedance Value: 460 Ohm
Lead Channel Impedance Value: 610 Ohm
Lead Channel Pacing Threshold Amplitude: 0.5 V
Lead Channel Pacing Threshold Amplitude: 0.75 V
Lead Channel Pacing Threshold Pulse Width: 0.5 ms
Lead Channel Pacing Threshold Pulse Width: 0.5 ms
Lead Channel Sensing Intrinsic Amplitude: 12 mV
Lead Channel Sensing Intrinsic Amplitude: 5 mV
Lead Channel Setting Pacing Amplitude: 2 V
Lead Channel Setting Pacing Amplitude: 2.5 V
Lead Channel Setting Pacing Pulse Width: 0.5 ms
Lead Channel Setting Sensing Sensitivity: 2 mV
Pulse Gen Model: 2272
Pulse Gen Serial Number: 3964109

## 2023-09-03 NOTE — Telephone Encounter (Signed)
 Prescription refill request for Eliquis  received. Indication:afib Last office visit:10/24 Scr:1.07  9/24 Age: 89 Weight:90.9  kg  Prescription refilled

## 2023-09-05 ENCOUNTER — Ambulatory Visit: Payer: Self-pay | Admitting: Internal Medicine

## 2023-09-12 ENCOUNTER — Encounter: Payer: Self-pay | Admitting: Internal Medicine

## 2023-09-12 ENCOUNTER — Ambulatory Visit: Attending: Internal Medicine | Admitting: Internal Medicine

## 2023-09-12 VITALS — BP 140/78 | HR 60 | Ht 68.0 in | Wt 204.2 lb

## 2023-09-12 DIAGNOSIS — R55 Syncope and collapse: Secondary | ICD-10-CM | POA: Diagnosis not present

## 2023-09-12 NOTE — Progress Notes (Signed)
 HPI Mr. Zarcone returns today for followup. He is a pleasant 89 yo man with a h/o [place] syncope and intermittent CHB who underwent PPM insertion over 2 years ago. He has done well in the interim with no limitations to physical activity. He denies chest pain or sob. No edema. He has a little white coat HTN.   No Known Allergies   Current Outpatient Medications  Medication Sig Dispense Refill   acetaminophen  (TYLENOL ) 325 MG tablet Take 2 tablets (650 mg total) by mouth every 6 (six) hours as needed for mild pain (or Fever >/= 101).     atorvastatin  (LIPITOR) 10 MG tablet TAKE 1 TABLET BY MOUTH EVERY DAY 90 tablet 3   b complex vitamins capsule Take 1 capsule by mouth daily.     clotrimazole-betamethasone (LOTRISONE) cream Apply 1 Application topically 2 (two) times daily.     Coenzyme Q10 (COQ10) 50 MG CAPS Take by mouth daily.     ELIQUIS  5 MG TABS tablet TAKE 1 TABLET BY MOUTH TWICE A DAY 60 tablet 11   Flaxseed, Linseed, (FLAX SEED OIL) 1000 MG CAPS Take 1 capsule by mouth 2 (two) times daily.     hydrALAZINE  (APRESOLINE ) 25 MG tablet TAKE 1 TABLET BY MOUTH EVERY 8 HOURS 270 tablet 3   latanoprost (XALATAN) 0.005 % ophthalmic solution Place 1 drop into both eyes at bedtime.     metoprolol  succinate (TOPROL -XL) 25 MG 24 hr tablet TAKE 1 TABLET (25 MG TOTAL) BY MOUTH DAILY. 90 tablet 3   olmesartan  (BENICAR ) 40 MG tablet TAKE 1 TABLET BY MOUTH EVERY DAY 90 tablet 3   omeprazole (PRILOSEC) 20 MG capsule Take 20 mg by mouth daily.     PUMPKIN SEED PO Take 1 capsule by mouth daily.     Saw Palmetto, Serenoa repens, (SAW PALMETTO PO) Take 1 capsule by mouth daily.     zinc gluconate 50 MG tablet Take 50 mg by mouth daily.     No current facility-administered medications for this visit.     Past Medical History:  Diagnosis Date   Barrett's esophagus 12/15/2015   CHB (complete heart block) (HCC)    GERD (gastroesophageal reflux disease)    Hypertension     ROS:   All  systems reviewed and negative except as noted in the HPI.   Past Surgical History:  Procedure Laterality Date   COLONOSCOPY     LIVER BIOPSY     Several Years ago per patient   PACEMAKER IMPLANT N/A 12/05/2020   Procedure: PACEMAKER IMPLANT;  Surgeon: Waddell Danelle ORN, MD;  Location: [facility];  Service: Cardiovascular;  Laterality: N/A;   UPPER GASTROINTESTINAL ENDOSCOPY       Family History  Problem Relation Age of Onset   Alzheimer's disease Sister    Healthy Sister    Heart disease Sister    Healthy Brother    Heart disease Brother    Kidney disease Brother    Heart disease Brother    Healthy Daughter      Social History   Socioeconomic History   Marital status: Widowed    Spouse name: Not on file   Number of children: 1   Years of education: Not on file   Highest education level: Not on file  Occupational History   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  Tobacco Use   Smoking status: Never    Passive exposure: Never   Smokeless tobacco: Never  Vaping Use  Vaping status: Never Used  Substance and Sexual Activity   Alcohol use: No   Drug use: No   Sexual activity: Not Currently  Other Topics Concern   Not on file  Social History Narrative   Not on file   Social Drivers of Health   Financial Resource Strain: Not on file  Food Insecurity: Not on file  Transportation Needs: Not on file  Physical Activity: Not on file  Stress: Not on file  Social Connections: Not on file  Intimate Partner Violence: Not on file     BP (!) 140/78 (BP Location: Right Arm, Patient Position: Sitting, Cuff Size: Large)   Pulse 60   Ht 5' 8 (1.727 m)   Wt 204 lb 3.2 oz (92.6 kg)   SpO2 97%   BMI 31.05 kg/m   Physical Exam:  Well appearing elderly man, looking younger than his stated age NAD HEENT: Unremarkable Neck:  No JVD, no thyromegally Lymphatics:  No adenopathy Back:  No CVA tenderness Lungs:  Clear HEART:  Regular rate rhythm, no murmurs, no  rubs, no clicks Abd:  soft, positive bowel sounds, no organomegally, no rebound, no guarding Ext:  2 plus pulses, no edema, no cyanosis, no clubbing Skin:  No rashes no nodules Neuro:  CN II through XII intact, motor grossly intact  DEVICE  Normal device function.  See PaceArt for details.   Assess/Plan:  CHB - he is device dependent today. He is asymptomatic s/p PPM insertion. I have tightened up his AV delays. He has no escape today Syncope - this has resolved since his PPM insertion. PPM -his St. Jude DDD PM is working normally and we will recheck in several months. HTN - he will continue toprol  and benicar .   Danelle Waddell COME

## 2023-09-12 NOTE — Patient Instructions (Signed)

## 2023-11-14 NOTE — Progress Notes (Signed)
 Remote pacemaker transmission.

## 2023-12-03 ENCOUNTER — Ambulatory Visit (INDEPENDENT_AMBULATORY_CARE_PROVIDER_SITE_OTHER): Payer: Medicare HMO

## 2023-12-03 DIAGNOSIS — I442 Atrioventricular block, complete: Secondary | ICD-10-CM

## 2023-12-04 LAB — CUP PACEART REMOTE DEVICE CHECK
Battery Remaining Longevity: 74 mo
Battery Remaining Percentage: 71 %
Battery Voltage: 2.98 V
Brady Statistic AP VP Percent: 77 %
Brady Statistic AP VS Percent: 1 %
Brady Statistic AS VP Percent: 23 %
Brady Statistic AS VS Percent: 1 %
Brady Statistic RA Percent Paced: 76 %
Brady Statistic RV Percent Paced: 99 %
Date Time Interrogation Session: 20250916020420
Implantable Lead Connection Status: 753985
Implantable Lead Connection Status: 753985
Implantable Lead Implant Date: 20220919
Implantable Lead Implant Date: 20220919
Implantable Lead Location: 753859
Implantable Lead Location: 753860
Implantable Pulse Generator Implant Date: 20220919
Lead Channel Impedance Value: 450 Ohm
Lead Channel Impedance Value: 610 Ohm
Lead Channel Pacing Threshold Amplitude: 0.5 V
Lead Channel Pacing Threshold Amplitude: 1 V
Lead Channel Pacing Threshold Pulse Width: 0.5 ms
Lead Channel Pacing Threshold Pulse Width: 0.5 ms
Lead Channel Sensing Intrinsic Amplitude: 12 mV
Lead Channel Sensing Intrinsic Amplitude: 3.5 mV
Lead Channel Setting Pacing Amplitude: 2 V
Lead Channel Setting Pacing Amplitude: 2.5 V
Lead Channel Setting Pacing Pulse Width: 0.5 ms
Lead Channel Setting Sensing Sensitivity: 2 mV
Pulse Gen Model: 2272
Pulse Gen Serial Number: 3964109

## 2023-12-05 ENCOUNTER — Other Ambulatory Visit: Payer: Self-pay | Admitting: Cardiovascular Disease

## 2023-12-06 ENCOUNTER — Ambulatory Visit: Payer: Self-pay | Admitting: Internal Medicine

## 2023-12-09 NOTE — Progress Notes (Signed)
 Remote PPM Transmission

## 2024-01-01 ENCOUNTER — Encounter (INDEPENDENT_AMBULATORY_CARE_PROVIDER_SITE_OTHER): Payer: Self-pay | Admitting: Gastroenterology

## 2024-01-21 ENCOUNTER — Encounter: Payer: Self-pay | Admitting: Internal Medicine

## 2024-01-21 ENCOUNTER — Ambulatory Visit: Attending: Internal Medicine | Admitting: Internal Medicine

## 2024-01-21 VITALS — BP 124/64 | HR 61 | Ht 68.0 in | Wt 200.0 lb

## 2024-01-21 DIAGNOSIS — I480 Paroxysmal atrial fibrillation: Secondary | ICD-10-CM | POA: Diagnosis not present

## 2024-01-21 DIAGNOSIS — I442 Atrioventricular block, complete: Secondary | ICD-10-CM | POA: Diagnosis not present

## 2024-01-21 DIAGNOSIS — I10 Essential (primary) hypertension: Secondary | ICD-10-CM | POA: Diagnosis not present

## 2024-01-21 DIAGNOSIS — Z7901 Long term (current) use of anticoagulants: Secondary | ICD-10-CM | POA: Insufficient documentation

## 2024-01-21 MED ORDER — METOPROLOL SUCCINATE ER 25 MG PO TB24: 25.0000 mg | ORAL_TABLET | Freq: Every day | ORAL | 3 refills | Status: AC

## 2024-01-21 NOTE — Progress Notes (Signed)
 Cardiology Office Note  Date: 01/21/2024   ID: Brett Bryant, DOB May 22, 1933, MRN [id number]  PCP:  Brett Leta NOVAK, MD  Cardiologist:  Brett SHAUNNA Maywood, MD Electrophysiologist:  None   History of Present Illness: Brett Bryant is a 89 y.o. male  No interval ER visits or hospitalizations.  Does not have any angina or DOE.  No dizziness, syncope, palpitations.  He has minimal leg swelling.  No bleeding complications.  On Eliquis .  Compliant with medications.  No risk of falls.  He has 1 dog, 2 cats (third cat died around 2 weeks ago), 5 chickens and flower garden.  Past Medical History:  Diagnosis Date   Barrett's esophagus 12/15/2015   CHB (complete heart block) (HCC)    GERD (gastroesophageal reflux disease)    Hypertension     Past Surgical History:  Procedure Laterality Date   COLONOSCOPY     LIVER BIOPSY     Several Years ago per patient   PACEMAKER IMPLANT N/A 12/05/2020   Procedure: PACEMAKER IMPLANT;  Surgeon: Brett Danelle ORN, MD;  [facility];  Service: Cardiovascular;  Laterality: N/A;   UPPER GASTROINTESTINAL ENDOSCOPY      Current Outpatient Medications  Medication Sig Dispense Refill   acetaminophen  (TYLENOL ) 325 MG tablet Take 2 tablets (650 mg total) by mouth every 6 (six) hours as needed for mild pain (or Fever >/= 101).     atorvastatin  (LIPITOR) 10 MG tablet TAKE 1 TABLET BY MOUTH EVERY DAY 90 tablet 0   b complex vitamins capsule Take 1 capsule by mouth daily.     clotrimazole-betamethasone (LOTRISONE) cream Apply 1 Application topically 2 (two) times daily.     Coenzyme Q10 (COQ10) 50 MG CAPS Take by mouth daily.     ELIQUIS  5 MG TABS tablet TAKE 1 TABLET BY MOUTH TWICE A DAY 60 tablet 11   Flaxseed, Linseed, (FLAX SEED OIL) 1000 MG CAPS Take 1 capsule by mouth 2 (two) times daily.     hydrALAZINE  (APRESOLINE ) 25 MG tablet TAKE 1 TABLET BY MOUTH EVERY 8 HOURS 270 tablet 3   latanoprost (XALATAN) 0.005 % ophthalmic solution Place 1  drop into both eyes at bedtime.     olmesartan  (BENICAR ) 40 MG tablet TAKE 1 TABLET BY MOUTH EVERY DAY 90 tablet 3   omeprazole (PRILOSEC) 20 MG capsule Take 20 mg by mouth daily.     PUMPKIN SEED PO Take 1 capsule by mouth daily.     Saw Palmetto, Serenoa repens, (SAW PALMETTO PO) Take 1 capsule by mouth daily.     zinc gluconate 50 MG tablet Take 50 mg by mouth daily.     metoprolol  succinate (TOPROL -XL) 25 MG 24 hr tablet Take 1 tablet (25 mg total) by mouth daily. 90 tablet 3   No current facility-administered medications for this visit.   Allergies:  Patient has no known allergies.   Social History: The patient  reports that he has never smoked. He has never been exposed to tobacco smoke. He has never used smokeless tobacco. He reports that he does not drink alcohol and does not use drugs.   Family History: The patient's family history includes Alzheimer's disease in his sister; Healthy in his brother, daughter, and sister; Heart disease in his brother, brother, and sister; Kidney disease in his brother.   ROS:  Please see the history of present illness. Otherwise, complete review of systems is positive for none  All other systems are reviewed and negative.  Physical Exam: VS:  BP 124/64 (BP Location: Right Arm, Patient Position: Sitting, Cuff Size: Normal)   Pulse 61   Ht 5' 8 (1.727 m)   Wt 200 lb (90.7 kg)   SpO2 99%   BMI 30.41 kg/m , BMI Body mass index is 30.41 kg/m.  Wt Readings from Last 3 Encounters:  01/21/24 200 lb (90.7 kg)  09/12/23 204 lb 3.2 oz (92.6 kg)  12/31/22 200 lb 6.4 oz (90.9 kg)    General: Patient appears comfortable at rest. HEENT: Conjunctiva and lids normal, oropharynx clear with moist mucosa. Neck: Supple, no elevated JVP or carotid bruits, no thyromegaly. Lungs: Clear to auscultation, nonlabored breathing at rest. Cardiac: Regular rate and rhythm, no S3 or significant systolic murmur, no pericardial rub. Abdomen: Soft, nontender, no  hepatomegaly, bowel sounds present, no guarding or rebound. Extremities: 1+ pitting edema Skin: Warm and dry. Musculoskeletal: No kyphosis. Neuropsychiatric: Alert and oriented x3, affect grossly appropriate.  Recent Labwork: No results found for requested labs within last 365 days.     Component Value Date/Time   CHOL 212 (H) 12/16/2014 1041   TRIG 143 12/16/2014 1041   HDL 49 12/16/2014 1041   CHOLHDL 4.3 12/16/2014 1041   VLDL 29 12/16/2014 1041   LDLCALC 134 (H) 12/16/2014 1041     Assessment and Plan:  Ascending aorta dilatation: 42 mm in 2022 and 41 mm in 2024.  Due to advanced age, will defer further imaging.  CHB s/p Saint Jude PPM: Asymptomatic, follows with electrophysiology.  Paroxysmal atrial fibrillation: Pacemaker device interrogation recently in 08/2022 showed 5 AMS with controlled rates, longest duration 20 hours 24 minutes, A-fib burden less than 1%.  EP started him on Eliquis  5 mg twice daily, no bleeding complications.  Continue Eliquis  5 mg twice daily.  No risk of falls.  HTN, controlled: Home blood pressure is controlled.  Continue current antihypertensive medications.  Metoprolol  succinate 25 mg once daily, olmesartan  40 mg once daily and hydralazine  25 mg every 8 hours.  Follows up with PCP.  Carotid artery stenosis 1-39%: Not on aspirin  due to Eliquis  use.    I spent a total duration of 30 minutes reviewing the prior notes, more than 3 labs, discussion and documentation.      Medication Adjustments/Labs and Tests Ordered: Current medicines are reviewed at length with the patient today.  Concerns regarding medicines are outlined above.    Disposition:  Follow up 1.5 year  Signed Brett Pomales Priya Jaecob Lowden, MD, 01/21/2024 12:15 PM    [facility] at [facility] [address] [place], [place], [place] [address]

## 2024-01-21 NOTE — Patient Instructions (Addendum)
 Medication Instructions:  Your physician recommends that you continue on your current medications as directed. Please refer to the Current Medication list given to you today.   Labwork: None  Testing/Procedures: None  Follow-Up: Your physician recommends that you schedule a follow-up appointment in: 1.5 years. You will receive a reminder call in about 10 months reminding you to schedule your appointment. If you don't receive this call, please contact our office.   Any Other Special Instructions Will Be Listed Below (If Applicable). Thank you for choosing [facility]!     If you need a refill on your cardiac medications before your next appointment, please call your pharmacy.

## 2024-03-03 ENCOUNTER — Ambulatory Visit: Payer: Medicare HMO

## 2024-03-03 DIAGNOSIS — I442 Atrioventricular block, complete: Secondary | ICD-10-CM | POA: Diagnosis not present

## 2024-03-04 ENCOUNTER — Other Ambulatory Visit: Payer: Self-pay | Admitting: Internal Medicine

## 2024-03-04 ENCOUNTER — Encounter (INDEPENDENT_AMBULATORY_CARE_PROVIDER_SITE_OTHER): Payer: Self-pay | Admitting: *Deleted

## 2024-03-06 LAB — CUP PACEART REMOTE DEVICE CHECK
Battery Remaining Longevity: 70 mo
Battery Remaining Percentage: 68 %
Battery Voltage: 2.98 V
Brady Statistic AP VP Percent: 76 %
Brady Statistic AP VS Percent: 1 %
Brady Statistic AS VP Percent: 24 %
Brady Statistic AS VS Percent: 1 %
Brady Statistic RA Percent Paced: 75 %
Brady Statistic RV Percent Paced: 99 %
Date Time Interrogation Session: 20251216023012
Implantable Lead Connection Status: 753985
Implantable Lead Connection Status: 753985
Implantable Lead Implant Date: 20220919
Implantable Lead Implant Date: 20220919
Implantable Lead Location: 753859
Implantable Lead Location: 753860
Implantable Pulse Generator Implant Date: 20220919
Lead Channel Impedance Value: 440 Ohm
Lead Channel Impedance Value: 560 Ohm
Lead Channel Pacing Threshold Amplitude: 0.5 V
Lead Channel Pacing Threshold Amplitude: 1 V
Lead Channel Pacing Threshold Pulse Width: 0.5 ms
Lead Channel Pacing Threshold Pulse Width: 0.5 ms
Lead Channel Sensing Intrinsic Amplitude: 12 mV
Lead Channel Sensing Intrinsic Amplitude: 4.8 mV
Lead Channel Setting Pacing Amplitude: 2 V
Lead Channel Setting Pacing Amplitude: 2.5 V
Lead Channel Setting Pacing Pulse Width: 0.5 ms
Lead Channel Setting Sensing Sensitivity: 2 mV
Pulse Gen Model: 2272
Pulse Gen Serial Number: 3964109

## 2024-03-06 NOTE — Progress Notes (Signed)
 Remote PPM Transmission

## 2024-03-15 ENCOUNTER — Ambulatory Visit: Payer: Self-pay | Admitting: Internal Medicine

## 2024-03-18 ENCOUNTER — Other Ambulatory Visit: Payer: Self-pay | Admitting: Cardiovascular Disease

## 2024-04-07 ENCOUNTER — Encounter (INDEPENDENT_AMBULATORY_CARE_PROVIDER_SITE_OTHER): Payer: Self-pay | Admitting: Gastroenterology

## 2024-04-07 ENCOUNTER — Ambulatory Visit (INDEPENDENT_AMBULATORY_CARE_PROVIDER_SITE_OTHER): Admitting: Gastroenterology

## 2024-04-07 ENCOUNTER — Telehealth (INDEPENDENT_AMBULATORY_CARE_PROVIDER_SITE_OTHER): Payer: Self-pay

## 2024-04-07 VITALS — BP 133/64 | HR 60 | Temp 97.5°F | Ht 68.0 in | Wt 199.0 lb

## 2024-04-07 DIAGNOSIS — K2270 Barrett's esophagus without dysplasia: Secondary | ICD-10-CM | POA: Diagnosis not present

## 2024-04-07 DIAGNOSIS — K7469 Other cirrhosis of liver: Secondary | ICD-10-CM | POA: Diagnosis not present

## 2024-04-07 NOTE — Telephone Encounter (Signed)
 Spoke with patient, scheduled RUQ US  for 04/14/2024 at 9:30am. Patient aware and verbalized understanding. NPO midnight, arrive 15 min prior.

## 2024-04-07 NOTE — Progress Notes (Signed)
 "  Referring Provider: Rosamond Leta NOVAK, MD Primary Care Physician:  Rosamond Leta NOVAK, MD Primary GI Physician: Previously Dr. Golda (Dr. Cinderella)  Chief Complaint  Patient presents with   Cirrhosis    Pt arrives with daughter [name] to discuss Cirrhosis. Pt states he did have cirrhosis but Dr.Rehman said it was gone. No questions/issues at this time.    HPI:   Brett Bryant is a 89 y.o. male with past medical history of BE, GERD, HTN, cirrhosis   Patient presenting today for:  Cirrhosis Barrett's esophagus   Last seen in 2022, at that time, doing well, having syncopal episodes. Heartburn well controlled. Weight stable.   States he is here today to reestablish care for his cirrhosis. States he has not been having US  or labs in regards to his liver as Dr. Golda did this previously. Denies any swelling to his abdomen. He reports some swelling to his Right leg which his PCP has evaluated. This has been ongoing for atleast a year. He has tried support stockings, but has had issues getting these to fit him. Denies any episodes of confusion or altered mental status. Denies melena, hematemesis, rectal bleeding. No jaundice of pruritus.   He is taking omeprazole 20mg  daily which keeps things well controlled. No dysphagia. No nausea, vomiting, changes in appetite or weight loss.   NSAID use: tylenol  PRN  Social hx:no etoh or tobacco   Last AFP 01/2021 4 Last US  abd: 01/2021 hepatic steatosis without focal lesions, cholelithiasis  Prior work-up: He was diagnosed with cirrhosis when he presented with new onset of ascites in December 2001.   Other studies were negative.   had laparoscopic liver biopsy by Dr. 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   His hepatic function improved and his ascites resolved and he has not required any diuretic therapy for years.   Last EGD March 2020 for chronic GERD.  He has short segment Barrett's esophagus. Last colonoscopy was in March 2010.   He has history of colonic adenomas.  He decided not to have any more exams.   Past Medical History:  Diagnosis Date   Barrett's esophagus 12/15/2015   CHB (complete heart block) (HCC)    GERD (gastroesophageal reflux disease)    Hypertension     Past Surgical History:  Procedure Laterality Date   COLONOSCOPY     LIVER BIOPSY     Several Years ago per patient   PACEMAKER IMPLANT N/A 12/05/2020   Procedure: PACEMAKER IMPLANT;  Surgeon: Waddell Danelle ORN, MD;  Location: [facility];  Service: Cardiovascular;  Laterality: N/A;   UPPER GASTROINTESTINAL ENDOSCOPY      Current Outpatient Medications  Medication Sig Dispense Refill   acetaminophen  (TYLENOL ) 325 MG tablet Take 2 tablets (650 mg total) by mouth every 6 (six) hours as needed for mild pain (or Fever >/= 101).     atorvastatin  (LIPITOR) 10 MG tablet TAKE 1 TABLET BY MOUTH EVERY DAY 90 tablet 2   b complex vitamins capsule Take 1 capsule by mouth daily.     Coenzyme Q10 (COQ10) 50 MG CAPS Take by mouth daily.     ELIQUIS  5 MG TABS tablet TAKE 1 TABLET BY MOUTH TWICE A DAY 60 tablet 11   Flaxseed, Linseed, (FLAX SEED OIL) 1000 MG CAPS Take 1 capsule by mouth 2 (two) times daily.     hydrALAZINE  (APRESOLINE ) 25 MG tablet TAKE 1 TABLET BY MOUTH EVERY 8 HOURS 270 tablet 3  metoprolol  succinate (TOPROL -XL) 25 MG 24 hr tablet Take 1 tablet (25 mg total) by mouth daily. 90 tablet 3   olmesartan  (BENICAR ) 40 MG tablet TAKE 1 TABLET BY MOUTH EVERY DAY 90 tablet 3   omeprazole (PRILOSEC) 20 MG capsule Take 20 mg by mouth daily.     PUMPKIN SEED PO Take 1 capsule by mouth daily.     Saw Palmetto, Serenoa repens, (SAW PALMETTO PO) Take 1 capsule by mouth daily.     Vitamin D-Vitamin K (D3 + K2 PO) Take by mouth.     zinc gluconate 50 MG tablet Take 50 mg by mouth daily.     clotrimazole-betamethasone (LOTRISONE) cream Apply 1 Application topically 2 (two) times daily. (Patient not taking: Reported on 04/07/2024)     latanoprost  (XALATAN) 0.005 % ophthalmic solution Place 1 drop into both eyes at bedtime. (Patient not taking: Reported on 04/07/2024)     No current facility-administered medications for this visit.    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  Tobacco Use   Smoking status: Never    Passive exposure: Never   Smokeless tobacco: Never  Vaping Use   Vaping status: Never Used  Substance and Sexual Activity   Alcohol use: No   Drug use: No   Sexual activity: Not Currently  Other Topics Concern   Not on file  Social History Narrative   Not on file   Social Drivers of Health   Tobacco Use: Low Risk (04/07/2024)   Patient History    Smoking Tobacco Use: Never    Smokeless Tobacco Use: Never    Passive Exposure: Never  Financial Resource Strain: Not on file  Food Insecurity: Not on file  Transportation Needs: Not on file  Physical Activity: Not on file  Stress: Not on file  Social Connections: Not on file  Depression (EYV7-0): Not on file  Alcohol Screen: Not on file  Housing: Not on file  Utilities: Not on file  Health Literacy: Not on file    Review of systems General: negative for malaise, night sweats, fever, chills, weight loss Neck: Negative for lumps, goiter, pain and significant neck swelling Resp: Negative for cough, wheezing, dyspnea at rest CV: Negative for chest pain, leg swelling, palpitations, orthopnea GI: denies melena, hematochezia, nausea, vomiting, diarrhea, constipation, dysphagia, odyonophagia, early satiety or unintentional weight loss.  MSK: Negative for joint pain or swelling, back pain, and muscle pain. Derm: Negative for itching or rash Psych: Denies depression, anxiety, memory loss,  confusion. No homicidal or suicidal ideation.  Heme: Negative for prolonged bleeding, bruising easily, and swollen nodes. Endocrine: Negative for cold or heat intolerance, polyuria, polydipsia and goiter. Neuro: negative for tremor, gait imbalance, syncope and seizures. The remainder of the review of systems is noncontributory.  Physical Exam: BP 133/64   Pulse 60   Temp (!) 97.5 F (36.4 C)   Ht 5' 8 (1.727 m)   Wt 199 lb (90.3 kg)   BMI 30.26 kg/m  General:   Alert and oriented. No distress noted. Pleasant and cooperative.  Head:  Normocephalic and atraumatic. Eyes:  Conjuctiva clear without scleral icterus. Mouth:  Oral mucosa pink and moist. Good  dentition. No lesions. Heart: Normal rate and rhythm, s1 and s2 heart sounds present.  Lungs: Clear lung sounds in all lobes. Respirations equal and unlabored. Abdomen:  +BS, soft, non-tender and non-distended. No rebound or guarding. No HSM or masses noted. Derm: No palmar erythema or jaundice Msk:  Symmetrical without gross deformities. Normal posture. Extremities:  Without edema. Neurologic:  Alert and  oriented x4 Psych:  Alert and cooperative. Normal mood and affect.  Invalid input(s): 6 MONTHS   ASSESSMENT: Brett Bryant is a 89 y.o. 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 He has remained well compensated, though was lost to follow up since last in 2022 with labs and US  at that time reassuring. Denies any AMS, ascites, jaundice, pruritus. Discussion had with patient regarding implications of disease to include importance of routine labs and imaging every 6 months with increased risk of incidence of HCC, bleeding, especially with increased risk of esophageal varices, retention of ammonia/fluid, importance of avoiding alcohol and NSAIDs. Pt verbalized understanding of this.  Will update labs and US  and determine need for EV screening via EGD at  follow up.   History of BE: SSBE, last EGD in 2020. GERD well controlled on omeprazole 20mg  daily. We will discuss repeat EGD at follow up visit, as above may also need EV screening after labs done, therefore can perform evaluation for both at the same time if needed.     PLAN:  -CBC, CMP, INR, AFP -RUQ US  -discuss EGD at follow up visit  -Reduce salt intake to <2 g per day - Can take Tylenol  max of 2 g per day (650 mg q8h) for pain - Avoid NSAIDs for pain - Avoid eating raw oysters/shellfish - Ensure every night before going to sleep   All questions were answered, patient verbalized understanding and is in agreement with plan as outlined above.   Follow Up: 2 months   Amjad Fikes L. Olivianna Higley, MSN, APRN, AGNP-C Adult-Gerontology Nurse Practitioner [facility]  "

## 2024-04-07 NOTE — Patient Instructions (Signed)
 We will update labs and US  in regards to your liver disease  - Reduce salt intake to <2 g per day - Can take Tylenol  max of 2 g per day (650 mg q8h) for pain - Avoid NSAIDs for pain - Avoid eating raw oysters/shellfish - Ensure every night before going to sleep   Follow up 2 months  It was a pleasure to see you today. I want to create trusting relationships with patients and provide genuine, compassionate, and quality care. I truly value your feedback! please be on the lookout for a survey regarding your visit with me today. I appreciate your input about our visit and your time in completing this!    Cheston Coury L. Kanai Berrios, MSN, APRN, AGNP-C Adult-Gerontology Nurse Practitioner [facility]

## 2024-04-08 LAB — AFP TUMOR MARKER: AFP-Tumor Marker: 2.7 ng/mL

## 2024-04-08 LAB — CBC
HCT: 34.1 % — ABNORMAL LOW (ref 39.4–51.1)
Hemoglobin: 11.3 g/dL — ABNORMAL LOW (ref 13.2–17.1)
MCH: 29 pg (ref 27.0–33.0)
MCHC: 33.1 g/dL (ref 31.6–35.4)
MCV: 87.7 fL (ref 81.4–101.7)
MPV: 12.1 fL (ref 7.5–12.5)
Platelets: 127 Thousand/uL — ABNORMAL LOW (ref 140–400)
RBC: 3.89 Million/uL — ABNORMAL LOW (ref 4.20–5.80)
RDW: 14.8 % (ref 11.0–15.0)
WBC: 5.4 Thousand/uL (ref 3.8–10.8)

## 2024-04-08 LAB — PROTIME-INR
INR: 1.1
Prothrombin Time: 11.4 s (ref 9.0–11.5)

## 2024-04-08 LAB — COMPREHENSIVE METABOLIC PANEL WITH GFR
AG Ratio: 1.5 (calc) (ref 1.0–2.5)
ALT: 15 U/L (ref 9–46)
AST: 20 U/L (ref 10–35)
Albumin: 4.3 g/dL (ref 3.6–5.1)
Alkaline phosphatase (APISO): 89 U/L (ref 35–144)
BUN: 19 mg/dL (ref 7–25)
CO2: 28 mmol/L (ref 20–32)
Calcium: 11.4 mg/dL — ABNORMAL HIGH (ref 8.6–10.3)
Chloride: 101 mmol/L (ref 98–110)
Creat: 1.2 mg/dL (ref 0.70–1.22)
Globulin: 2.9 g/dL (ref 1.9–3.7)
Glucose, Bld: 95 mg/dL (ref 65–139)
Potassium: 5.8 mmol/L — ABNORMAL HIGH (ref 3.5–5.3)
Sodium: 134 mmol/L — ABNORMAL LOW (ref 135–146)
Total Bilirubin: 0.4 mg/dL (ref 0.2–1.2)
Total Protein: 7.2 g/dL (ref 6.1–8.1)
eGFR: 58 mL/min/1.73m2 — ABNORMAL LOW

## 2024-04-09 ENCOUNTER — Encounter (INDEPENDENT_AMBULATORY_CARE_PROVIDER_SITE_OTHER): Payer: Self-pay

## 2024-04-14 ENCOUNTER — Ambulatory Visit (HOSPITAL_COMMUNITY)

## 2024-04-15 ENCOUNTER — Ambulatory Visit (INDEPENDENT_AMBULATORY_CARE_PROVIDER_SITE_OTHER): Payer: Self-pay | Admitting: Gastroenterology

## 2024-05-28 ENCOUNTER — Ambulatory Visit (INDEPENDENT_AMBULATORY_CARE_PROVIDER_SITE_OTHER): Admitting: Gastroenterology
